# Patient Record
Sex: Female | Born: 1990 | Race: Black or African American | Hispanic: No | Smoking: Former smoker
Health system: Southern US, Community
[De-identification: ages and names within clinical notes are randomized; demographics above are authoritative.]

## PROBLEM LIST (undated history)

## (undated) DIAGNOSIS — E119 Type 2 diabetes mellitus without complications: Secondary | ICD-10-CM

## (undated) DIAGNOSIS — R51 Headache: Secondary | ICD-10-CM

## (undated) DIAGNOSIS — G932 Benign intracranial hypertension: Secondary | ICD-10-CM

## (undated) DIAGNOSIS — F32A Depression, unspecified: Secondary | ICD-10-CM

## (undated) DIAGNOSIS — F329 Major depressive disorder, single episode, unspecified: Secondary | ICD-10-CM

## (undated) DIAGNOSIS — F419 Anxiety disorder, unspecified: Secondary | ICD-10-CM

## (undated) DIAGNOSIS — G43909 Migraine, unspecified, not intractable, without status migrainosus: Secondary | ICD-10-CM

## (undated) DIAGNOSIS — O24419 Gestational diabetes mellitus in pregnancy, unspecified control: Secondary | ICD-10-CM

## (undated) DIAGNOSIS — H4710 Unspecified papilledema: Secondary | ICD-10-CM

## (undated) HISTORY — DX: Major depressive disorder, single episode, unspecified: F32.9

## (undated) HISTORY — DX: Migraine, unspecified, not intractable, without status migrainosus: G43.909

## (undated) HISTORY — DX: Gestational diabetes mellitus in pregnancy, unspecified control: O24.419

## (undated) HISTORY — DX: Benign intracranial hypertension: G93.2

## (undated) HISTORY — DX: Anxiety disorder, unspecified: F41.9

## (undated) HISTORY — PX: TONSILLECTOMY: SUR1361

## (undated) HISTORY — DX: Depression, unspecified: F32.A

## (undated) HISTORY — PX: APPENDECTOMY: SHX54

## (undated) HISTORY — DX: Headache: R51

---

## 2012-09-25 HISTORY — PX: LUMBAR PUNCTURE: SHX1985

## 2018-02-21 ENCOUNTER — Other Ambulatory Visit: Payer: Self-pay

## 2018-02-21 ENCOUNTER — Encounter: Payer: Self-pay | Admitting: Neurology

## 2018-02-21 ENCOUNTER — Ambulatory Visit (INDEPENDENT_AMBULATORY_CARE_PROVIDER_SITE_OTHER): Payer: 59 | Admitting: Neurology

## 2018-02-21 DIAGNOSIS — R519 Headache, unspecified: Secondary | ICD-10-CM | POA: Insufficient documentation

## 2018-02-21 DIAGNOSIS — R51 Headache: Secondary | ICD-10-CM

## 2018-02-21 DIAGNOSIS — G8929 Other chronic pain: Secondary | ICD-10-CM | POA: Insufficient documentation

## 2018-02-21 HISTORY — DX: Headache, unspecified: R51.9

## 2018-02-21 HISTORY — DX: Other chronic pain: G89.29

## 2018-02-21 MED ORDER — PROPRANOLOL HCL 20 MG PO TABS: ORAL_TABLET | ORAL | 3 refills | Status: DC

## 2018-02-21 NOTE — Patient Instructions (Signed)
We will start propranolol for the headache.     Inderal (propranolol) is a blood pressure medication that is commonly used for migraine headaches. This is a type of beta blocker. The most common side effects include low heart rate, dizziness, fatigue, and increased depression. This medication may worsen asthma. If you believe that you are having side effects on this medication, please contact our office.  

## 2018-02-21 NOTE — Progress Notes (Signed)
Reason for visit: Headache  Referring physician: Dr. Kirstie Peri is a 27 y.o. female  History of present illness:  [name] is a 27 year old right-handed black female with a history of headaches.  The patient initially was evaluated for headaches in 2014 in [place], [place].  The patient underwent an MRI of the brain that was unremarkable, she underwent a lumbar puncture with an opening pressure of 22, barely above normal.  The patient was diagnosed with pseudotumor cerebri at that time, she was started on Topamax which offered little benefit for her daily headaches.  The patient eventually was seen through [facility], she again was placed on Topamax again without benefit.  At some point, the patient had a repeat lumbar puncture, the patient claims that the opening pressure was 14 cm of water.  I do not see this documented in the notes.  The patient went off of the Topamax and has been off of medication since that time.  The patient has been noted in the past to have increased intraocular pressure, particularly on the left with pressures as high as 28.  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  The patient continues to have daily headaches that are mainly bifrontal in nature associated with a burning sensation.  The patient may have photophobia and phonophobia, some nausea and vomiting with the headache.  She will miss work on occasion.  She does indicate some neck stiffness, she may have some muffled hearing at times, she reports some spots in front of the eyes and some occasional sparkly lights in the vision.  The patient also reports some occasional double vision.  She denies any issues with control of the bowels or the bladder, she does have some dizziness at times and some mild gait instability.  The patient is sent to this office for further evaluation and management of the headache.  Past Medical History:  Diagnosis Date  . Anxiety   . Chronic  headache disorder 02/21/2018  . Depression   . Migraine     Past Surgical History:  Procedure Laterality Date  . APPENDECTOMY    . LUMBAR PUNCTURE  2014   2017   . TONSILLECTOMY      Family History  Problem Relation Age of Onset  . Diabetes Mother   . Migraines Father   . Diabetes Father   . Hypercholesterolemia Father   . Kidney disease Father   . Diabetes Sister     Social history:  reports that she has been smoking.  She has never used smokeless tobacco. She reports that she drinks alcohol. She reports that she does not use drugs.  Medications:  Prior to Admission medications   Not on File      Allergies  Allergen Reactions  . No Known Allergies     ROS:  Out of a complete 14 system review of symptoms, the patient complains only of the following symptoms, and all other reviewed systems are negative.  Chest pain, swelling in the legs Ringing in the ears Birthmarks Blurred vision, double vision, eye pain Snoring Lymph node swelling Joint pain, muscle cramps, aching muscles Headache, dizziness Depression, anxiety  Blood pressure 138/80, pulse 72, height  (1.651 m), weight 269 lb (122 kg).  Physical Exam  General: The patient is alert and cooperative at the time of the examination.  The patient is markedly obese.  Eyes: Pupils are equal, round, and reactive to light. Discs appear to be  blurred bilaterally, no venous pulsations are seen.  Neck: The neck is supple, no carotid bruits are noted.  Respiratory: The respiratory examination is clear.  Cardiovascular: The cardiovascular examination reveals a regular rate and rhythm, no obvious murmurs or rubs are noted.  Skin: Extremities are without significant edema.  Neurologic Exam  Mental status: The patient is alert and oriented x 3 at the time of the examination. The patient has apparent normal recent and remote memory, with an apparently normal attention span and concentration ability.  Cranial  nerves: Facial symmetry is present. There is good sensation of the face to pinprick and soft touch bilaterally. The strength of the facial muscles and the muscles to head turning and shoulder shrug are normal bilaterally. Speech is well enunciated, no aphasia or dysarthria is noted. Extraocular movements are full. Visual fields are full. The tongue is midline, and the patient has symmetric elevation of the soft palate. No obvious hearing deficits are noted.  Motor: The motor testing reveals 5 over 5 strength of all 4 extremities. Good symmetric motor tone is noted throughout.  Sensory: Sensory testing is intact to pinprick, soft touch, vibration sensation, and position sense on all 4 extremities. No evidence of extinction is noted.  Coordination: Cerebellar testing reveals good finger-nose-finger and heel-to-shin bilaterally.  Gait and station: Gait is normal. Tandem gait is normal. Romberg is negative. No drift is seen.  Reflexes: Deep tendon reflexes are symmetric and normal bilaterally. Toes are downgoing bilaterally.   Assessment/Plan:  1.  Chronic daily headache  The patient appears to have had a work-up in the past that does not confirm the diagnosis of pseudotumor cerebri.  The initial lumbar puncture opening pressure was 22, barely out of the normal range.  The patient claims that the second lumbar puncture had an opening pressure of 14 which was normal.  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  The patient will be started on propranolol taking 20 mg twice daily for 2 weeks and go to 40 mg twice daily to treat presumed migraine headache.  If the headaches persist, we may repeat MRI of the brain and consider a repeat lumbar puncture.  The intraocular pressure issue will need to be followed over time.  The patient will follow-up in about 4 months.  Marlan Palau MD 02/21/2018 8:56  AM  [facility] [address] [place], [place] [address]  Phone [phone] Fax [phone]

## 2018-05-23 ENCOUNTER — Telehealth: Payer: Self-pay | Admitting: Neurology

## 2018-05-23 MED ORDER — GABAPENTIN 300 MG PO CAPS: ORAL_CAPSULE | ORAL | 3 refills | Status: DC

## 2018-05-23 NOTE — Telephone Encounter (Signed)
The patient is on 40 mg twice daily of propranolol without any benefit.  I will send in a prescription for gabapentin and work-up on the dose.  Patient will be seen within the next month, if she is no better we may consider repeat lumbar puncture.

## 2018-05-23 NOTE — Telephone Encounter (Signed)
propranolol (INDERAL) 20 MG tablet is not helping with HA's. Pt is taking excedrin or ibuprofren on a daily basis also. Please call

## 2018-05-30 MED ORDER — PREDNISONE 5 MG PO TABS
ORAL_TABLET | ORAL | 0 refills | Status: DC
Start: 2018-05-30 — End: 2018-06-08

## 2018-05-30 NOTE — Telephone Encounter (Signed)
Pt requesting a call, stating that the medication change had not helped with her headaches. Pt then went to the ED where she was told to f/u with Dr. Anne Hahn in 24 to 48 hours. Requesting a call to see if she can be worked in sooner. Please call to advise.

## 2018-05-30 NOTE — Addendum Note (Signed)
Addended by: York Spaniel on: 05/30/2018 05:03 PM   Modules accepted: Orders

## 2018-05-30 NOTE — Telephone Encounter (Signed)
Dr. Anne Hahn- please advise. There are no available appt this week. Next available work in is Monday at 730am

## 2018-05-30 NOTE — Telephone Encounter (Signed)
I called the patient.  Patient still has ongoing daily headaches, she will go up on the gabapentin taking 300 mg in the morning and 600 mg in the evening.  I will give her a 6-day trial on prednisone, I will try to get her worked in for next week.

## 2018-05-31 NOTE — Telephone Encounter (Signed)
Called, LVM for pt offering Monday 06/03/18 at 730am for work in appt. Asked her to call back and let us know if this works.

## 2018-05-31 NOTE — Telephone Encounter (Signed)
Pt called back and spoke with phone staff, Virl Axe.She accepted appt for 06/03/18 at 730am, check in 7am. She was scheduled.

## 2018-06-03 ENCOUNTER — Ambulatory Visit (INDEPENDENT_AMBULATORY_CARE_PROVIDER_SITE_OTHER): Payer: 59 | Admitting: Neurology

## 2018-06-03 ENCOUNTER — Encounter: Payer: Self-pay | Admitting: Neurology

## 2018-06-03 ENCOUNTER — Telehealth: Payer: Self-pay | Admitting: Neurology

## 2018-06-03 VITALS — BP 163/115 | HR 71 | Ht 61.5 in | Wt 267.0 lb

## 2018-06-03 DIAGNOSIS — G4489 Other headache syndrome: Secondary | ICD-10-CM

## 2018-06-03 DIAGNOSIS — R51 Headache: Secondary | ICD-10-CM

## 2018-06-03 DIAGNOSIS — R519 Headache, unspecified: Secondary | ICD-10-CM

## 2018-06-03 MED ORDER — ALPRAZOLAM 0.5 MG PO TABS: ORAL_TABLET | ORAL | 0 refills | Status: DC

## 2018-06-03 MED ORDER — NORTRIPTYLINE HCL 10 MG PO CAPS: ORAL_CAPSULE | ORAL | 3 refills | Status: DC

## 2018-06-03 NOTE — Addendum Note (Signed)
Addended by: York Spaniel on: 06/03/2018 01:05 PM   Modules accepted: Orders

## 2018-06-03 NOTE — Patient Instructions (Signed)
Go back on the propranolol, we will start nortriptyline for the headache.  Pamelor (nortriptyline) is an antidepressant medication that has many uses that may include headache, whiplash injuries, or for peripheral neuropathy pain. Side effects may include drowsiness, dry mouth, blurred vision, or constipation. As with any antidepressant medication, worsening depression may occur. If you had any significant side effects, please call our office. The full effects of this medication may take 7-10 days after starting the drug, or going up on the dose.

## 2018-06-03 NOTE — Telephone Encounter (Signed)
A prescription for alprazolam will be sent in. 

## 2018-06-03 NOTE — Telephone Encounter (Signed)
MR Brain wo contrast Dr. Anne Hahn [facility] Auth: [id number] (exp. 06/03/18 to 07/18/18). Patient is scheduled for 06/11/18 at [facility].  Patient informed me she is claustrophobic and would like something to help her nerves.

## 2018-06-03 NOTE — Progress Notes (Signed)
Reason for visit: Headache  Kimberly Meadows is an 27 y.o. female  History of present illness:  Kimberly Meadows is a 27 year old right-handed black female with a history of chronic daily headache.  The patient has been evaluated for possible pseudotumor cerebri, apparently she has had 2 prior spinal taps that did not show extremely elevated pressures, the first opening pressure was 22 cm, the other was 14.  The patient has been on Topamax and this did not help her headache.  The patient recently was placed on propranolol taking 40 mg twice daily.  Her headaches have continued, she was placed on gabapentin within the last week or so, she felt that this was worsening her headaches, she had nausea and vomiting with this.  She stopped the gabapentin.  The patient however went off of Inderal when she started the gabapentin.  The patient indicates that she generally has early morning headaches, the headaches usually around the right eye and the right side of the head.  The patient has been found to have elevated intraocular pressures, most significant on the right.  The patient does report some photophobia and phonophobia when the headache is severe.  The patient drinks 1 cup of coffee in the morning and a 32 ounce [facility] later in the day.  She comes to this office for an evaluation.  Past Medical History:  Diagnosis Date  . Anxiety   . Chronic headache disorder 02/21/2018  . Depression   . Migraine     Past Surgical History:  Procedure Laterality Date  . APPENDECTOMY    . LUMBAR PUNCTURE  2014   2017   . TONSILLECTOMY      Family History  Problem Relation Age of Onset  . Diabetes Mother   . Migraines Father   . Diabetes Father   . Hypercholesterolemia Father   . Kidney disease Father   . Diabetes Sister     Social history:  reports that she has been smoking. She has never used smokeless tobacco. She reports that she drinks alcohol. She reports that she does not use drugs.      Allergies  Allergen Reactions  . No Known Allergies     Medications:  Prior to Admission medications   Medication Sig Start Date End Date Taking? Authorizing Provider  gabapentin (NEURONTIN) 300 MG capsule 1 capsule daily for 1 week, then take 1 twice daily Patient not taking: Reported on 06/03/2018 05/23/18   York Spaniel, MD  predniSONE (DELTASONE) 5 MG tablet Begin taking 6 tablets daily, taper by one tablet daily until off the medication. Patient not taking: Reported on 06/03/2018 05/30/18   York Spaniel, MD  propranolol (INDERAL) 20 MG tablet One tablet twice a day for 2 weeks, then take 2 tablets twice a day Patient not taking: Reported on 06/03/2018 02/21/18   York Spaniel, MD    ROS:  Out of a complete 14 system review of symptoms, the patient complains only of the following symptoms, and all other reviewed systems are negative.  Decreased appetite, chills Facial swelling, ear pain Eye itching, eye redness, light sensitivity, double vision, eye pain, blurred vision Chest tightness Abdominal pain, nausea, vomiting Dizziness, headache, weakness, facial droop  Blood pressure (!) 163/115, pulse 71, height 5' 1.5" (1.562 m), weight 267 lb (121.1 kg).  Physical Exam  General: The patient is alert and cooperative at the time of the examination.  The patient is markedly obese.  Skin: No significant peripheral edema is  noted.   Neurologic Exam  Mental status: The patient is alert and oriented x 3 at the time of the examination. The patient has apparent normal recent and remote memory, with an apparently normal attention span and concentration ability.   Cranial nerves: Facial symmetry is present. Speech is normal, no aphasia or dysarthria is noted. Extraocular movements are full. Visual fields are full.  Pupils are equal, round, and reactive to light.  Discs appear to be with blurred margins, no hemorrhages are seen.  Motor: The patient has good strength in all 4  extremities.  Sensory examination: Soft touch sensation is symmetric on the face, arms, and legs.  Coordination: The patient has good finger-nose-finger and heel-to-shin bilaterally.  Gait and station: The patient has a normal gait. Tandem gait is normal. Romberg is negative. No drift is seen.  Reflexes: Deep tendon reflexes are symmetric.   Assessment/Plan:  1.  Chronic daily headache  2.  Questionable pseudotumor cerebri  The patient will need close follow-up through ophthalmology to monitor intraocular pressures and look for developing papilledema.  The patient will undergo another MRI of the brain, the patient may be set up for a lumbar puncture.  The patient has had very high blood pressures today, this likely in part is related to withdrawal from beta-blocker medication and headache pain.  The patient believe that the gabapentin worsens her headache, but she had stopped the Inderal when she started the gabapentin.  The patient is having early morning headaches, I have asked her to cut back on her caffeine intake as she may be having rebound headaches.  Nortriptyline will be added, working up to a 30 mg dose.  The patient will go back on Inderal.  She has not gone on prednisone as prescribed yet.  She will follow-up in 3 months.  Marlan Palau MD 06/03/2018 7:30 AM  [facility] [address] [place], [place] [address]  Phone [phone] Fax [phone]

## 2018-06-04 ENCOUNTER — Telehealth: Payer: Self-pay | Admitting: Neurology

## 2018-06-04 ENCOUNTER — Other Ambulatory Visit: Payer: Self-pay

## 2018-06-04 ENCOUNTER — Encounter (HOSPITAL_COMMUNITY): Payer: Self-pay | Admitting: Emergency Medicine

## 2018-06-04 ENCOUNTER — Emergency Department (HOSPITAL_COMMUNITY)
Admission: EM | Admit: 2018-06-04 | Discharge: 2018-06-04 | Disposition: A | Discharge status: Home / Self Care | Payer: 59 | Attending: Emergency Medicine | Admitting: Emergency Medicine

## 2018-06-04 DIAGNOSIS — R030 Elevated blood-pressure reading, without diagnosis of hypertension: Secondary | ICD-10-CM | POA: Insufficient documentation

## 2018-06-04 DIAGNOSIS — G43009 Migraine without aura, not intractable, without status migrainosus: Secondary | ICD-10-CM | POA: Insufficient documentation

## 2018-06-04 DIAGNOSIS — F17210 Nicotine dependence, cigarettes, uncomplicated: Secondary | ICD-10-CM | POA: Diagnosis not present

## 2018-06-04 DIAGNOSIS — G43909 Migraine, unspecified, not intractable, without status migrainosus: Secondary | ICD-10-CM | POA: Diagnosis present

## 2018-06-04 LAB — POC URINE PREG, ED: Preg Test, Ur: NEGATIVE

## 2018-06-04 MED ORDER — ONDANSETRON 4 MG PO TBDP
4.0000 mg | ORAL_TABLET | Freq: Once | ORAL | Status: AC | PRN
  Administered 2018-06-04: 4 mg via ORAL
  Filled 2018-06-04: qty 1

## 2018-06-04 MED ORDER — PROCHLORPERAZINE EDISYLATE 10 MG/2ML IJ SOLN: 10.0000 mg | Freq: Once | INTRAMUSCULAR | Status: DC

## 2018-06-04 MED ORDER — DIPHENHYDRAMINE HCL 50 MG/ML IJ SOLN: 25.0000 mg | Freq: Once | INTRAMUSCULAR | Status: DC

## 2018-06-04 MED ORDER — DIPHENHYDRAMINE HCL 50 MG/ML IJ SOLN
12.5000 mg | Freq: Once | INTRAMUSCULAR | Status: AC
  Administered 2018-06-04: 12.5 mg via INTRAVENOUS
  Filled 2018-06-04: qty 1

## 2018-06-04 MED ORDER — SODIUM CHLORIDE 0.9 % IV BOLUS
1000.0000 mL | Freq: Once | INTRAVENOUS | Status: AC
  Administered 2018-06-04: 1000 mL via INTRAVENOUS

## 2018-06-04 MED ORDER — KETOROLAC TROMETHAMINE 15 MG/ML IJ SOLN
15.0000 mg | Freq: Once | INTRAMUSCULAR | Status: AC
  Administered 2018-06-04: 15 mg via INTRAVENOUS
  Filled 2018-06-04: qty 1

## 2018-06-04 MED ORDER — PROCHLORPERAZINE EDISYLATE 10 MG/2ML IJ SOLN
10.0000 mg | Freq: Once | INTRAMUSCULAR | Status: AC
  Administered 2018-06-04: 10 mg via INTRAVENOUS
  Filled 2018-06-04: qty 2

## 2018-06-04 MED ORDER — KETOROLAC TROMETHAMINE 15 MG/ML IJ SOLN: 15.0000 mg | Freq: Once | INTRAMUSCULAR | Status: DC

## 2018-06-04 NOTE — ED Provider Notes (Signed)
[facility] EMERGENCY DEPARTMENT Provider Note   CSN: [id number] Arrival date & time: 06/04/18  0033     History   Chief Complaint Chief Complaint  Patient presents with  . Migraine    HPI Kimberly Meadows is a 27 y.o. female with history of migraine presenting for 5 days of headache.  Patient states that her current episode began on Thursday with bilateral parietal and occipital headache that radiates down towards her bilateral neck and shoulders.  She states that the pain is throbbing in nature and 8/10 intensity.  She states that the pain has been constant for the last 4 days.  Patient states that she also developed intermittent nausea and vomiting on Thursday.  She states that it is normal for her to have nausea and vomiting when she has migraines.  Patient states that her headache is made worse by light and loud noises. Patient states that this is the normal type of pain and duration for her migraines, however she does state that her pain is somewhat further back in her head than normal, she states that the pain normally originates around her eyes.  Patient denies history of fevers, vision changes, gait problems, speech problems, facial asymmetry, numbness/tingling or weakness.  Patient was seen yesterday at [facility] where she states that her medications were changed and she was started on nortriptyline and restarted on her propanolol.  Patient states that she was taken off her propanolol 2 weeks ago by another provider so she has not been taking this medication.  Patient states that she had 1 dose of nortriptyline yesterday around 7 PM without relief of her headache and she presented to the emergency department.  Patient states she was recently taken off her propanolol two weeks ago before being told to restart it yesterday. She states that she has not restarted propanolol yet. Patient states that her blood pressure has been elevated since stopping  the propanolol.  From 06/03/2018 shows her assessment of chronic daily headache and questionable pseudotumor cerebri it appears that the patient had a lumbar puncture in 2014 with opening pressures of 22.  It appears their plan as of yesterday was to have the patient follow-up with ophthalmology to monitor intraocular pressures and possible future LP and MRI with follow-up in 3 months and to start propanolol and nortriptyline.  HPI  Past Medical History:  Diagnosis Date  . Anxiety   . Chronic headache disorder 02/21/2018  . Depression   . Migraine     Patient Active Problem List   Diagnosis Date Noted  . Chronic headache disorder 02/21/2018    Past Surgical History:  Procedure Laterality Date  . APPENDECTOMY    . LUMBAR PUNCTURE  2014   2017   . TONSILLECTOMY       OB History   None      Home Medications    Prior to Admission medications   Medication Sig Start Date End Date Taking? Authorizing Provider  ALPRAZolam Prudy Feeler) 0.5 MG tablet Take 2 tablets approximately 45 minutes prior to the MRI study, take a third tablet if needed. 06/03/18   York Spaniel, MD  gabapentin (NEURONTIN) 300 MG capsule 1 capsule daily for 1 week, then take 1 twice daily Patient not taking: Reported on 06/03/2018 05/23/18   York Spaniel, MD  nortriptyline (PAMELOR) 10 MG capsule Take one capsule at night for one week, then take 2 capsules at night for one week, then take 3 capsules at night 06/03/18  York Spaniel, MD  predniSONE (DELTASONE) 5 MG tablet Begin taking 6 tablets daily, taper by one tablet daily until off the medication. Patient not taking: Reported on 06/03/2018 05/30/18   York Spaniel, MD  propranolol (INDERAL) 20 MG tablet One tablet twice a day for 2 weeks, then take 2 tablets twice a day Patient not taking: Reported on 06/03/2018 02/21/18   York Spaniel, MD    Family History Family History  Problem Relation Age of Onset  . Diabetes Mother   . Migraines Father   .  Diabetes Father   . Hypercholesterolemia Father   . Kidney disease Father   . Diabetes Sister     Social History Social History   Tobacco Use  . Smoking status: Current Every Day Smoker  . Smokeless tobacco: Never Used  Substance Use Topics  . Alcohol use: Yes    Comment: rare  . Drug use: Never     Allergies   No known allergies   Review of Systems Review of Systems  Constitutional: Negative.  Negative for chills, fatigue and fever.  Eyes: Positive for photophobia. Negative for visual disturbance.  Gastrointestinal: Positive for nausea and vomiting. Negative for abdominal pain and diarrhea.  Musculoskeletal: Positive for neck pain. Negative for gait problem and neck stiffness.  Skin: Negative.  Negative for rash.  Neurological: Positive for headaches. Negative for dizziness, syncope, weakness, light-headedness and numbness.  All other systems reviewed and are negative.    Physical Exam Updated Vital Signs BP (!) 165/95   Pulse 62   Temp 98.1 F (36.7 C) (Oral)   Resp 16   SpO2 100%   Physical Exam  Constitutional: She is oriented to person, place, and time. She appears well-developed and well-nourished. No distress.  HENT:  Head: Normocephalic and atraumatic.  Right Ear: External ear normal.  Left Ear: External ear normal.  Nose: Nose normal.  Eyes: Pupils are equal, round, and reactive to light. Conjunctivae and EOM are normal.  Neck: Trachea normal, normal range of motion, full passive range of motion without pain and phonation normal. Neck supple. No spinous process tenderness present. No neck rigidity. No tracheal deviation present. No Brudzinski's sign and no Kernig's sign noted.  Cardiovascular: Normal rate, regular rhythm, normal heart sounds and intact distal pulses.  Pulmonary/Chest: Effort normal and breath sounds normal. No respiratory distress.  Abdominal: Soft. Bowel sounds are normal. There is no tenderness. There is no rebound and no guarding.    Musculoskeletal: Normal range of motion.  Neurological: She is alert and oriented to person, place, and time. She has normal strength. No cranial nerve deficit or sensory deficit. She displays a negative Romberg sign. Coordination normal. GCS eye subscore is 4. GCS verbal subscore is 5. GCS motor subscore is 6.  Mental Status: Alert, oriented, thought content appropriate, able to give a coherent history. Speech fluent without evidence of aphasia. Able to follow 2 step commands without difficulty. Cranial Nerves: II: Peripheral visual fields grossly normal, pupils equal, round, reactive to light III,IV, VI: ptosis not present, extra-ocular motions intact bilaterally V,VII: smile symmetric, eyebrows raise symmetric, facial light touch sensation equal VIII: hearing grossly normal to voice X: uvula elevates symmetrically XI: bilateral shoulder shrug symmetric and strong XII: midline tongue extension without fassiculations Motor: Normal tone. 5/5 strength in upper and lower extremities bilaterally including strong and equal grip strength and dorsiflexion/plantar flexion Sensory: Sensation intact to light touch in all extremities.Negative Romberg.  Cerebellar: normal finger-to-nose with bilateral upper extremities.  Normal heel-to -shin balance bilaterally of the lower extremity. No pronator drift.  Gait: normal gait and balance CV: distal pulses palpable throughout  Skin: Skin is warm and dry. Capillary refill takes less than 2 seconds.  Psychiatric: She has a normal mood and affect. Her behavior is normal.     ED Treatments / Results  Labs (all labs ordered are listed, but only abnormal results are displayed) Labs Reviewed  POC URINE PREG, ED    EKG None  Radiology No results found.  Procedures Procedures (including critical care time)  Medications Ordered in ED Medications  ondansetron (ZOFRAN-ODT) disintegrating tablet 4 mg (4 mg Oral Given 06/04/18 0111)  sodium  chloride 0.9 % bolus 1,000 mL (0 mLs Intravenous Stopped 06/04/18 1032)  ondansetron (ZOFRAN-ODT) disintegrating tablet 4 mg (4 mg Oral Given 06/04/18 0719)  ketorolac (TORADOL) 15 MG/ML injection 15 mg (15 mg Intravenous Given 06/04/18 0809)  prochlorperazine (COMPAZINE) injection 10 mg (10 mg Intravenous Given 06/04/18 0809)  diphenhydrAMINE (BENADRYL) injection 12.5 mg (12.5 mg Intravenous Given 06/04/18 0809)     Initial Impression / Assessment and Plan / ED Course  I have reviewed the triage vital signs and the nursing notes.  Pertinent labs & imaging results that were available during my care of the patient were reviewed by me and considered in my medical decision making (see chart for details).  Clinical Course as of Jun 04 1118  Tue Jun 04, 2018  1610 On reevaluation patient states that she is feeling improved.  Describes her headache as minimal at this time.   [BM]  1038 Informed by RN that headache has been alleviated.    [BM]    Clinical Course User Index [BM] Bill Salinas, PA-C   Deniss Wormley is a 27 y.o. female who presents to ED for five days of headache consistent with her typical migraine. No focal neuro deficits on exam. Migraine cocktail and fluids given, headache treated and improved while in ED; patient denies any pain at time of discharge.  Nausea controlled with Zofran, no vomiting since administration, tolerating p.o. fluids without difficulty or vomiting.  On re-evaluation, patient well appearing, sitting comfortably in bed, NAD. The patient denies any neurologic symptoms such as visual changes, focal numbness/weakness, balance problems, confusion, or speech difficulty to suggest a life-threatening intracranial process such as intracranial hemorrhage or mass. The patient has no clotting risk factors thus venous sinus thrombosis is unlikely. No fevers, neck pain or nuchal rigidity to suggest meningitis. Patient is afebrile, non-toxic and well appearing. Reassuring  neuro exam, normal gait to the door and back. No cranial deficits, no speech deficits, negative pronator drift, normal/equal strength to all extremities.   I feel that the patient is safe for discharge home at this time. Neurology and PCP follow up strongly encouraged. I have reviewed return precautions including development of fever, nausea/vomiting or neurologic symptoms, vision changes, confusion, lethargy, difficulty speaking/walking, or other new/worsening/concerning symptoms. Patient states understanding of return precautions. Patient is agreeable to discharge. All questions answered.  The patient was noted to have elevated BP in ED today. Hx of HTN and has not taken daily medications in two weeks. I have spoken with the patient regarding elevated blood pressure readings and the need for improved management. I instructed the patient to followup with their PCP within 1 week for BP check. I also counseled the patient regarding the signs and symptoms which would require an emergent visit to an emergency department for hypertensive urgency and/or hypertensive emergency.  At this time there does not appear to be any evidence of an acute emergency medical condition and the patient appears stable for discharge with appropriate outpatient follow up. Diagnosis was discussed with patient who verbalizes understanding of care plan and is agreeable to discharge. I have discussed return precautions with patient who verbalize understanding of return precautions. Patient strongly encouraged to follow-up with their PCP. All questions answered.  Patient's case discussed with Dr. Deretha Emory who agrees with plan to discharge with follow-up.     Note: Portions of this report may have been transcribed using voice recognition software. Every effort was made to ensure accuracy; however, inadvertent computerized transcription errors may still be present.  Final Clinical Impressions(s) / ED Diagnoses   Final diagnoses:    Migraine without aura and without status migrainosus, not intractable  Elevated blood pressure reading    ED Discharge Orders    None       Elizabeth Palau 06/04/18 1129    Vanetta Mulders, MD 06/09/18 [phone]

## 2018-06-04 NOTE — ED Notes (Signed)
Pt crying, experiencing increased pain d/t light and noise in hall bed. Moved to treatment room.

## 2018-06-04 NOTE — Discharge Instructions (Addendum)
Please return to the Emergency Department for any new or worsening symptoms or if your symptoms do not improve. Please be sure to follow up with your Primary Care Physician as soon as possible regarding your visit today. If you do not have a Primary Doctor please use the resources below to establish one. Please take your prophylactic migraine medications as directed by your neurologist.  Please call your neurologist today to schedule a follow-up appointment for as soon as possible regarding your migraine. Your blood pressure was elevated today, please take your blood pressure medication as directed by your primary care doctor.  Please follow-up with your primary care doctor as soon as possible for blood pressure recheck and medication management.  Contact a health care provider if: You develop symptoms that are different or more severe than your usual migraine symptoms. Get help right away if: Your migraine becomes severe. You have a fever. You have a stiff neck. You have vision loss. Your muscles feel weak or like you cannot control them. You start to lose your balance often. You develop trouble walking. You faint.  RESOURCE GUIDE  Chronic Pain Problems: Contact [facility]  [phone] Patients need to be referred by their primary care doctor.  Insufficient Money for Medicine: Contact United Way:  call "211" or [facility] (928)804-9331.  No Primary Care Doctor: Call Health Connect  [phone] - can help you locate a primary care doctor that  accepts your insurance, provides certain services, etc. Physician Referral Service- [phone]  Agencies that provide inexpensive medical care: [facility]  [phone] [facility]  [phone] [facility]  [phone] [facility]  [phone] Planned Parenthood  [phone] [facility]  [phone]  Medicaid-accepting [facility] Providers: [facility]-  [address]  [phone], Mon-Fri 9am-7pm, Sat 9am-1pm [facility]- [address] [place], Suite [place]  [phone] [facility]- [address], [address] [place]  [phone] [facility]- [address]  [phone] Renaye Rakers- [address] [place], [address], [phone]  Only accepts [place] patients after they have their name  applied to their card  Self Pay (no insurance) in [facility]: Sickle Cell Patients: Dr Willey Blade, [facility]  [address] [place], [phone] [facility]- [address] [place]  [phone]       [facility] [place]- [address]       -     [facility]- see information above (Speak to [employer] if you do not have insurance)       -  [facility]- [address] [place], [phone]       -  [facility]- [address],  [phone]       -  [facility]- [address] [phone]       -  Dr Julio Sicks-  [address], [place], [phone]       -  [facility]- [address] [phone]       -  [facility]- [address] [phone], also [address], [phone]       -    [facility]- [address] [place], [phone], 1st & 3rd Saturday   every month, 10am-1pm  1) Find a Librarian, academic and Pay Out of Pocket Although you won't have to find out who is covered by your insurance plan, it is a good idea to ask around and get recommendations. You will then need to call the office and see if the doctor you have chosen will accept you as a new patient and what types of options they offer for patients who are self-pay. Some doctors offer discounts or will set up payment plans for their patients who do not have insurance, but you will need to ask so you aren't surprised when you get to your appointment.  2) Contact Your Local Health Department Not all  health departments have doctors that can see patients for sick visits, but many do, so it is worth a call to see if yours does. If you don't know where your local health department is, you can check in your phone book. The CDC also has a tool to help you locate your state's health department, and many state websites also have listings of all of their local health departments.  3) Find a Walk-in Clinic If your illness is not likely to be very severe or complicated, you may want to try a walk in clinic. These are popping up all over the country in pharmacies, drugstores, and shopping centers. They're usually staffed by nurse practitioners or physician assistants that have been trained to treat common illnesses and complaints. They're usually fairly quick and inexpensive. However, if you have serious medical issues or chronic medical problems, these are probably not your best option  STD Testing [facility] Department of [facility], STD Clinic, [address], [place], phone [phone] or [phone].  Monday - Friday, call for an appointment. [facility] Department of [employer], STD Clinic, [place] [address], [place], phone [phone] or [phone].  Monday - Friday, call for an appointment.  Abuse/Neglect: [facility] Child Abuse Hotline 832-415-4875 [facility] Child Abuse Hotline 561-867-6253 (After Hours)  Emergency Shelter:  [facility] 519-475-8878  Maternity Homes: Room at the [place] [facility] 947-605-3761 [facility] ([phone]  MRSA Hotline #:   (616)508-7831  [facility] Resources  [facility] [place]  United [employer] [facility] Dept. [address]                 [address]         [address] [place] [address]  Blondell Reveal Phone:  [phone]                                   Phone:  [phone]                   Phone:  (820)[phone]  [facility], [phone] [phone]       -     [facility] in [place], [place] [address]  [address]                                  ([phone], [facility] Child Abuse Hotline 416-110-1535 or 613-178-5395 (After Hours)   Behavioral Health Services  Substance Abuse Resources: Alcohol and Drug Services  831-435-3986 [facility] [phone] [facility] [place] [phone] Floydene Flock ([phone] Residential & Outpatient Substance Abuse Program  [phone]  Psychological Services: [facility] Health  [phone] [facility]  [phone] [facility], ([phone] [place]. [address], [place], ACCESS LINE: 680-887-6924 or [phone], [employer]za  Dental Assistance  If unable to pay or uninsured, contact:  [facility] or [facility]. to become qualified for the adult dental clinic.  Patients with Medicaid: [facility] [phone] [address], [phone] [address] [phone]  If unable to pay, or uninsured, contact [facility] [phone]) or [facility] Department [phone] in [place], [phone] in [facility]) to become qualified for the adult dental clinic   Other Low-Cost Community Dental Services: [facility]- [address] [place], [place], [place], [address], [phone], [address], 2nd and 4th Thursday of the month at 6:30am.  10 clients each day by appointment, can sometimes see walk-in patients if someone does not show for an appointment. [facility]- [address] [place], [place], [address], [phone] [facility] [address] [place], [place], [address], [phone] [facility] Health Department- [phone] [facility] Health Department-  [phone] [facility] [phone]

## 2018-06-04 NOTE — ED Triage Notes (Signed)
Pt is under treatment by neuro for frequent headaches. Has had a bad headache since Thursday with nausea and vomiting. Was seen today and tried a new medication with no relief.

## 2018-06-04 NOTE — ED Notes (Signed)
Attempted to gain IV access x2, without success.  

## 2018-06-05 ENCOUNTER — Ambulatory Visit (INDEPENDENT_AMBULATORY_CARE_PROVIDER_SITE_OTHER): Payer: 59

## 2018-06-05 DIAGNOSIS — G4489 Other headache syndrome: Secondary | ICD-10-CM | POA: Diagnosis not present

## 2018-06-06 ENCOUNTER — Telehealth: Payer: Self-pay | Admitting: Neurology

## 2018-06-06 DIAGNOSIS — H4710 Unspecified papilledema: Secondary | ICD-10-CM

## 2018-06-06 NOTE — Telephone Encounter (Signed)
I called the patient.  MRI of the brain is unremarkable.  I will go ahead and get the spinal tap set up, if the pressure is elevated, we will initiate treatment.   MRI brain 06/06/18:  IMPRESSION:  Unremarkable MRI scan of the brain without contrast. Incidental noted finding of partially empty sella, low-lying cerebellar tonsils and mild supratentorial cortical atrophy.

## 2018-06-07 ENCOUNTER — Encounter (HOSPITAL_COMMUNITY): Payer: Self-pay | Admitting: Emergency Medicine

## 2018-06-07 ENCOUNTER — Other Ambulatory Visit: Payer: Self-pay

## 2018-06-07 ENCOUNTER — Emergency Department (HOSPITAL_COMMUNITY)
Admission: EM | Admit: 2018-06-07 | Discharge: 2018-06-08 | Disposition: A | Discharge status: Home / Self Care | Payer: 59 | Attending: Emergency Medicine | Admitting: Emergency Medicine

## 2018-06-07 DIAGNOSIS — R51 Headache: Secondary | ICD-10-CM

## 2018-06-07 DIAGNOSIS — F17200 Nicotine dependence, unspecified, uncomplicated: Secondary | ICD-10-CM | POA: Insufficient documentation

## 2018-06-07 DIAGNOSIS — G932 Benign intracranial hypertension: Secondary | ICD-10-CM

## 2018-06-07 DIAGNOSIS — R519 Headache, unspecified: Secondary | ICD-10-CM

## 2018-06-07 MED ORDER — HYDROCODONE-ACETAMINOPHEN 5-325 MG PO TABS
1.0000 | ORAL_TABLET | Freq: Once | ORAL | Status: AC
  Administered 2018-06-07: 1 via ORAL
  Filled 2018-06-07: qty 1

## 2018-06-07 MED ORDER — ONDANSETRON 4 MG PO TBDP
4.0000 mg | ORAL_TABLET | Freq: Once | ORAL | Status: AC
  Administered 2018-06-07: 4 mg via ORAL
  Filled 2018-06-07: qty 1

## 2018-06-07 NOTE — ED Triage Notes (Addendum)
Pt sent her by PCP, reports migraine x 1 week. Pt had MRI this week and was told to come here due to worsening bilateral disc edema and hx of idiopathic intra-cranial hypertension. PCP recommending lumbar puncture. Pt reports blurry vision.

## 2018-06-07 NOTE — ED Provider Notes (Signed)
Patient placed in Quick Look pathway, seen and evaluated   Chief Complaint: Headache  HPI:   Patient with history of idiopathic intercranial hypertension, 2 previous lumbar punctures last performed years ago, no current treatment presents with worsening diplopia and headaches.  Patient had an MRI 2 days ago which was essentially negative.  She saw her eye doctor today who noted worsening bilateral papilledema prompting emergency department evaluation tonight.  Patient presents with instructions requesting LP and initiating treatment with Diamox.  ROS:  Positive ROS: (+) Diplopia, headache Negative ROS: (-) Weakness in the arms of the legs, facial droop, slurred speech, difficulty walking  Physical Exam:   Gen: No distress  Neuro: Awake and Alert  Skin: Warm    Focused Exam: Eyes clear fundi with papilledema bilaterally; Heart RRR, nml S1,S2, no m/r/g; Lungs CTAB; Abd soft, NT, no rebound or guarding; Ext 2+ pedal pulses bilaterally, no edema; Neuro cranial nerves grossly intact, patient moves upper and lower extremities well with normal strength and coordination.  BP (!) 165/110   Pulse 77   Temp 98.3 F (36.8 C) (Oral)   Resp 18   Ht 5\' 4"  (1.626 m)   Wt 120.2 kg   LMP 05/11/2018   SpO2 99%   BMI 45.49 kg/m   Plan: Monitor.  No indications for additional imaging or lab work-up at this time.  Initiation of care has begun. The patient has been counseled on the process, plan, and necessity for staying for the completion/evaluation, and the remainder of the medical screening examination    Renne CriglerGeiple, Lon Klippel, Cordelia Poche-C 06/07/18 1904    Mesner, Barbara CowerJason, MD 06/08/18 [phone]

## 2018-06-08 ENCOUNTER — Emergency Department (HOSPITAL_COMMUNITY): Payer: 59

## 2018-06-08 LAB — CSF CELL COUNT WITH DIFFERENTIAL
RBC Count, CSF: 0 /mm3
TUBE #: 3
WBC, CSF: 0 /mm3 (ref 0–5)

## 2018-06-08 LAB — GLUCOSE, CSF: GLUCOSE CSF: 60 mg/dL (ref 40–70)

## 2018-06-08 LAB — POC URINE PREG, ED: Preg Test, Ur: NEGATIVE

## 2018-06-08 LAB — PROTEIN, CSF: TOTAL PROTEIN, CSF: 12 mg/dL — AB (ref 15–45)

## 2018-06-08 MED ORDER — SODIUM CHLORIDE 0.9 % IV BOLUS: 1000.0000 mL | Freq: Once | INTRAVENOUS | Status: DC

## 2018-06-08 MED ORDER — HYDROCODONE-ACETAMINOPHEN 5-325 MG PO TABS
1.0000 | ORAL_TABLET | Freq: Once | ORAL | Status: AC
  Administered 2018-06-08: 1 via ORAL
  Filled 2018-06-08: qty 1

## 2018-06-08 MED ORDER — METOCLOPRAMIDE HCL 5 MG/ML IJ SOLN: 10.0000 mg | Freq: Once | INTRAMUSCULAR | Status: DC

## 2018-06-08 MED ORDER — DEXAMETHASONE SODIUM PHOSPHATE 10 MG/ML IJ SOLN: 10.0000 mg | Freq: Once | INTRAMUSCULAR | Status: DC

## 2018-06-08 MED ORDER — METOCLOPRAMIDE HCL 10 MG PO TABS: 10.0000 mg | ORAL_TABLET | Freq: Four times a day (QID) | ORAL | 0 refills | Status: DC | PRN

## 2018-06-08 MED ORDER — METOCLOPRAMIDE HCL 10 MG PO TABS
10.0000 mg | ORAL_TABLET | Freq: Once | ORAL | Status: AC
  Administered 2018-06-08: 10 mg via ORAL
  Filled 2018-06-08: qty 1

## 2018-06-08 MED ORDER — KETOROLAC TROMETHAMINE 30 MG/ML IJ SOLN: 30.0000 mg | Freq: Once | INTRAMUSCULAR | Status: DC

## 2018-06-08 MED ORDER — DIPHENHYDRAMINE HCL 50 MG/ML IJ SOLN: 25.0000 mg | Freq: Once | INTRAMUSCULAR | Status: DC

## 2018-06-08 MED ORDER — LIDOCAINE HCL (PF) 1 % IJ SOLN
INTRAMUSCULAR | Status: AC
  Administered 2018-06-08: 1 mL via SUBCUTANEOUS
  Filled 2018-06-08: qty 5

## 2018-06-08 NOTE — ED Notes (Signed)
Report taken from night shift RN - care assumed at this time - resting quietly on stretcher with parents at bedside

## 2018-06-08 NOTE — ED Notes (Signed)
Pt states she does not want any needles. Refuses to have IV started and refusing all IV medicines.

## 2018-06-08 NOTE — ED Notes (Signed)
Spoke with radiology staff in reference to when they would be able to take pt - states radiologist arrived at 0900 - was setting up for procedure - would be ready at approx 1000 - additionally requested preg test to be done - urine sent to mini lab for same - will notify radiology when resulted - pt aware of same

## 2018-06-08 NOTE — ED Notes (Signed)
Pt sitting upright on stretcher with parents at bedside; pt states feeling "much better"; ready for d/c

## 2018-06-08 NOTE — ED Notes (Signed)
Pt assisted to rgt lateral recumbent position with towels placed behind back for support - states presently comfortable

## 2018-06-08 NOTE — ED Notes (Signed)
3 cups of ice water and 2 cups of apple sauce given - pt tolerating well

## 2018-06-08 NOTE — ED Notes (Signed)
Assisted pt to an upright position - sitting on bedside with feet dangling - denies dizziness

## 2018-06-08 NOTE — ED Provider Notes (Signed)
8:12 AM Care assumed from Dr. Judd Lienelo.  At time of transfer care, patient is awaiting therapeutic and diagnostic lumbar puncture by radiology team.  Previous ED physician spoke with Dr. Marisue HumbleSanford with radiology who reports someone will do it this morning.  Associated diagnostic spinal fluid orders will reportedly be placed when it is performed per the order set.  Plan of care is to follow-up on LP results and reassess patient to determine disposition.  9:00 AM Radiology called to now request that the specimen orders on the CSF be placed.  These orders were placed.  Anticipate following up on the results.  Results of LP showed no evidence of white blood cells or organisms in the CSF.  Other laboratory testing was reassuring.  CSF culture was sent.  Patient reports her headache has began to improve after the LP.  The opening pressure appears to document at 29 cm which is supportive of the possible pseudotumor diagnosis.    Patient will follow-up with her neurologist and your PCP.  Patient reports the Reglan helped her headache earlier.  Patient given prescription for oral Reglan.  Patient will follow-up with her previous team and understood return precautions.  Patient discharged in good condition for further outpatient management of her headaches.   Clinical Impression: 1. Bad headache   2. Pseudotumor cerebri     Disposition: Discharge  Condition: Good  I have discussed the results, Dx and Tx plan with the pt(& family if present). He/she/they expressed understanding and agree(s) with the plan. Discharge instructions discussed at great length. Strict return precautions discussed and pt &/or family have verbalized understanding of the instructions. No further questions at time of discharge.    New Prescriptions   METOCLOPRAMIDE (REGLAN) 10 MG TABLET    Take 1 tablet (10 mg total) by mouth every 6 (six) hours as needed for nausea (headache).    Follow Up: your neurologist and  PCP     [facility] EM[facility] [address] [id number] mc [place] [id number]       Tegeler, Canary Brimhristopher J, MD 06/08/18 [phone]

## 2018-06-08 NOTE — ED Provider Notes (Signed)
[facility] Provider Note   CSN: [id number] Arrival date & time: 06/07/18  1827     History   Chief Complaint Chief Complaint  Patient presents with  . Migraine    HPI Kimberly Meadows is a 27 y.o. female.  Patient is a 27 year old female with history of chronic headaches, pseudotumor cerebri.  She presents today for evaluation of headache and requesting an LP.  She was seen by both her neurologist and ophthalmologist in recent days.  She had an MRI which was negative, but her ophthalmologist stated that she had bilateral disc edema.  She was referred here for diagnostic/therapeutic LP and possible medical treatment.  Patient describes pressure throughout her head along with blurry vision that has worsened over the past week.  She denies any fevers, chills, or stiff neck.  The history is provided by the patient.  Migraine     Past Medical History:  Diagnosis Date  . Anxiety   . Chronic headache disorder 02/21/2018  . Depression   . Migraine     Patient Active Problem List   Diagnosis Date Noted  . Chronic headache disorder 02/21/2018    Past Surgical History:  Procedure Laterality Date  . APPENDECTOMY    . LUMBAR PUNCTURE  2014   2017   . TONSILLECTOMY       OB History   None      Home Medications    Prior to Admission medications   Medication Sig Start Date End Date Taking? Authorizing Provider  ALPRAZolam Prudy Feeler(XANAX) 0.5 MG tablet Take 2 tablets approximately 45 minutes prior to the MRI study, take a third tablet if needed. 06/03/18   York SpanielWillis, Charles K, MD  gabapentin (NEURONTIN) 300 MG capsule 1 capsule daily for 1 week, then take 1 twice daily Patient not taking: Reported on 06/03/2018 05/23/18   York SpanielWillis, Charles K, MD  nortriptyline (PAMELOR) 10 MG capsule Take one capsule at night for one week, then take 2 capsules at night for one week, then take 3 capsules at night 06/03/18   York SpanielWillis, Charles K, MD  predniSONE (DELTASONE) 5  MG tablet Begin taking 6 tablets daily, taper by one tablet daily until off the medication. Patient not taking: Reported on 06/03/2018 05/30/18   York SpanielWillis, Charles K, MD  propranolol (INDERAL) 20 MG tablet One tablet twice a day for 2 weeks, then take 2 tablets twice a day Patient not taking: Reported on 06/03/2018 02/21/18   York SpanielWillis, Charles K, MD    Family History Family History  Problem Relation Age of Onset  . Diabetes Mother   . Migraines Father   . Diabetes Father   . Hypercholesterolemia Father   . Kidney disease Father   . Diabetes Sister     Social History Social History   Tobacco Use  . Smoking status: Current Every Day Smoker  . Smokeless tobacco: Never Used  Substance Use Topics  . Alcohol use: Yes    Comment: rare  . Drug use: Never     Allergies   No known allergies   Review of Systems Review of Systems  All other systems reviewed and are negative.    Physical Exam Updated Vital Signs BP 135/87 (BP Location: Right Arm)   Pulse 62   Temp 97.7 F (36.5 C) (Oral)   Resp 16   Ht 5\' 4"  (1.626 m)   Wt 120.2 kg   LMP 05/11/2018   SpO2 96%   BMI 45.49 kg/m   Physical Exam  Constitutional: She is oriented to person, place, and time. She appears well-developed and well-nourished. No distress.  HENT:  Head: Normocephalic and atraumatic.  Eyes: Pupils are equal, round, and reactive to light. EOM are normal.  Funduscopic exam is somewhat limited, however I appreciate no significant papilledema.  Neck: Normal range of motion. Neck supple.  Cardiovascular: Normal rate and regular rhythm. Exam reveals no gallop and no friction rub.  No murmur heard. Pulmonary/Chest: Effort normal and breath sounds normal. No respiratory distress. She has no wheezes.  Abdominal: Soft. Bowel sounds are normal. She exhibits no distension. There is no tenderness.  Musculoskeletal: Normal range of motion.  Neurological: She is alert and oriented to person, place, and time. No cranial  nerve deficit. She exhibits normal muscle tone. Coordination normal.  Skin: Skin is warm and dry. She is not diaphoretic.  Nursing note and vitals reviewed.    ED Treatments / Results  Labs (all labs ordered are listed, but only abnormal results are displayed) Labs Reviewed - No data to display  EKG None  Radiology No results found.  Procedures Procedures (including critical care time)  Medications Ordered in ED Medications  HYDROcodone-acetaminophen (NORCO/VICODIN) 5-325 MG per tablet 1 tablet (1 tablet Oral Given 06/07/18 1915)  ondansetron (ZOFRAN-ODT) disintegrating tablet 4 mg (4 mg Oral Given 06/07/18 1914)     Initial Impression / Assessment and Plan / ED Course  I have reviewed the triage vital signs and the nursing notes.  Pertinent labs & imaging results that were available during my care of the patient were reviewed by me and considered in my medical decision making (see chart for details).  Patient was sent here by her optometrist and/or neurologist for a lumbar puncture.  She has a history of pseudotumor cerebri and has been having headaches recently.  She was told by her optometrist that she had bilateral papilledema and that she needed to come here for an LP.  She was told that an interventional radiologist would perform this test upon arriving here in the ER.  The patient arrived here at approximately 5:00, then waited in the waiting room for over 6 hours before being roomed.  When I saw the patient, she was very fatigued, unhappy, and frustrated.  I called interventional radiology as well as radiology, both who told me that this was not a procedure that would be done in the night, but could be performed in the morning.  I informed the patient about the timing of this procedure and offered to perform the lumbar puncture myself.  Her reply was "I do not want just anyone sticking a needle in my back".    She was very angry about this entire situation, but has decided  to wait until morning when radiology will do the study.  I have spoken with Dr. Marisue Humble from radiology who is aware of the situation and has assured me that this study can be performed in the morning.  Final Clinical Impressions(s) / ED Diagnoses   Final diagnoses:  None    ED Discharge Orders    None       Geoffery Lyons, MD 06/08/18 [phone]

## 2018-06-08 NOTE — ED Notes (Signed)
Pt transported for LP

## 2018-06-08 NOTE — ED Notes (Signed)
Pt ambulated to restroom without event; ED MD aware of present BP; pt reports no known h/o HTN; ED MD states to hydrate pt and reassess in approx 30 min - pt made aware of same

## 2018-06-08 NOTE — Discharge Instructions (Signed)
Your lumbar puncture results today did not show evidence of infection.  Your pressure was elevated and based on the report of your ophthalmologist, we suspect you have pseudotumor cerebri, or elevated CSF pressures causing headache.  Please follow-up with your primary doctor and your neurologist.  If any symptoms change or worsen, please return to the nearest emergency department.

## 2018-06-08 NOTE — ED Notes (Signed)
From radiology via stretcher per rad tech - awake, alert, oriented x4; pt informed that she must lie flat x3 hours post LP - pt verbalized understanding - family remains at bedside

## 2018-06-08 NOTE — ED Notes (Signed)
3 hrs have passed since LP being done; HOB elevated 30 degrees - pt tolerating well - will medicate for pain then prepare for d/c home

## 2018-06-08 NOTE — ED Notes (Signed)
Pt sent from optometrist for a referred lumbar puncture. Stated that she's been waiting since 5pm for procedure after checking in. Dr. Judd Lienelo said that he would perform the procedure and the pt refused, stating "I don't just a random doctor sticking a needle in me." Dr. Judd Lienelo made several consults to arrange procedure with radiology and went to update pt on status. Pt and family highly upset they would have to wait until morning. Continued to yell and curse at Dr. Judd Lienelo.

## 2018-06-08 NOTE — ED Notes (Signed)
Family members given sodas and coffee

## 2018-06-08 NOTE — ED Notes (Signed)
HOB elevated 45 degrees - pt reports feeling "sick and in pain"; pt appears pale and labile; skin warm, dry - will continue to monitor

## 2018-06-08 NOTE — Procedures (Signed)
Fluoro guided lumbar puncture performed at L2-L3.  Opening pressure 29 cm H2O.  12.5 ml clear CSF obtained.  No complications.  Please see full dictation in PACS for additional details.

## 2018-06-11 ENCOUNTER — Other Ambulatory Visit: Payer: 59

## 2018-06-11 LAB — CSF CULTURE W GRAM STAIN: Culture: NO GROWTH

## 2018-06-11 LAB — CSF CULTURE

## 2018-06-17 ENCOUNTER — Ambulatory Visit: Payer: 59 | Admitting: Neurology

## 2018-06-17 NOTE — Telephone Encounter (Signed)
error 

## 2018-07-10 ENCOUNTER — Encounter: Payer: Self-pay | Admitting: Neurology

## 2018-07-10 ENCOUNTER — Other Ambulatory Visit: Payer: Self-pay | Admitting: Neurology

## 2018-07-11 ENCOUNTER — Institutional Professional Consult (permissible substitution): Payer: 59 | Admitting: Neurology

## 2018-09-23 ENCOUNTER — Ambulatory Visit: Payer: 59 | Admitting: Neurology

## 2018-09-23 ENCOUNTER — Telehealth: Payer: Self-pay | Admitting: Neurology

## 2018-09-23 ENCOUNTER — Encounter: Payer: Self-pay | Admitting: Neurology

## 2018-09-23 NOTE — Telephone Encounter (Signed)
This patient did not show for a revisit appointment today. 

## 2018-10-02 ENCOUNTER — Encounter: Payer: Self-pay | Admitting: Neurology

## 2018-10-02 ENCOUNTER — Ambulatory Visit (INDEPENDENT_AMBULATORY_CARE_PROVIDER_SITE_OTHER): Payer: 59 | Admitting: Neurology

## 2018-10-02 VITALS — BP 146/105 | HR 80 | Ht 61.5 in | Wt 267.0 lb

## 2018-10-02 DIAGNOSIS — G932 Benign intracranial hypertension: Secondary | ICD-10-CM | POA: Insufficient documentation

## 2018-10-02 DIAGNOSIS — H4710 Unspecified papilledema: Secondary | ICD-10-CM | POA: Diagnosis not present

## 2018-10-02 DIAGNOSIS — R519 Headache, unspecified: Secondary | ICD-10-CM | POA: Insufficient documentation

## 2018-10-02 DIAGNOSIS — R51 Headache: Secondary | ICD-10-CM

## 2018-10-02 DIAGNOSIS — G8929 Other chronic pain: Secondary | ICD-10-CM

## 2018-10-02 DIAGNOSIS — E236 Other disorders of pituitary gland: Secondary | ICD-10-CM | POA: Diagnosis not present

## 2018-10-02 DIAGNOSIS — R0683 Snoring: Secondary | ICD-10-CM

## 2018-10-02 DIAGNOSIS — Z6841 Body Mass Index (BMI) 40.0 and over, adult: Secondary | ICD-10-CM

## 2018-10-02 MED ORDER — ACETAZOLAMIDE 250 MG PO TABS: 250.0000 mg | ORAL_TABLET | Freq: Two times a day (BID) | ORAL | 5 refills | Status: DC

## 2018-10-02 NOTE — Progress Notes (Signed)
SLEEP MEDICINE CLINIC   Provider:  Melvyn Novas, M D  Primary Care Physician:    Referring Provider: Aura Camps, MD /   Chief Complaint  Patient presents with  . New Patient (Initial Visit)    pt alone, rm 11. pt has been diagnosed possibly with pseudotumor cerebri, 3 LP's have been completed and had pressure taking off. pt has been told she used to snore. In 2008 she had tonsils and appendix  removed and snoring subsequently decreased. She tosses in sleep a lot, complains of some daytime sleepiness, she averages 4-5 uninterupted sleep at night. Never had a sleep study done before. pt also admits to waking up with headaches.     HPI:  Kimberly Meadows is a 28 y.o. female , seen here in a referral from Dr. Anne Hahn and Dr.  Karleen Hampshire for a sleep consultation.  Dr Anne Hahn notes : "Kimberly Meadows is a 28 year old right-handed black female with a history of chronic daily headache.  The patient has been evaluated for possible pseudotumor cerebri, apparently she has had 2 prior spinal taps that did not show extremely elevated pressures, the first opening pressure was 22 cm, the other was 14.  The patient has been on Topamax and this did not help her headache.  The patient recently was placed on propranolol taking 40 mg twice daily.  Her headaches have continued, she was placed on gabapentin within the last week or so, she felt that this was worsening her headaches, she had nausea and vomiting with this.  She stopped the gabapentin.  The patient however went off of Inderal when she started the gabapentin.  The patient indicates that she generally has early morning headaches, the headaches usually around the right eye and the right side of the head.  The patient has been found to have elevated intraocular pressures, most significant on the right.  The patient does report some photophobia and phonophobia when the headache is severe.  The patient drinks 1 cup of coffee in the morning and a 32 ounce [facility]  later in the day.  She comes to this office for an evaluation."  CD 10-02-2017, I have the pleasure of meeting Liam Graham today a 28 year old right-handed African-American female referred by Dr. Aura Camps, her ophthalmologist.  As stated above in Dr. Tawana Scale note the patient has been treated for pseudotumor cerebri and required 3 consecutive spinal taps.  There is a question if she has sleep apnea since she also snores.  A sleep study was requested by her ophthalmologist since she also wakes up with headaches which is often a sign of increased intracranial pressure, but can also be seen with hypoxemia hypercapnia. I reviewed Kimberly Meadows's most recent imaging studies she had an MRI of the brain without contrast on 05 June 2018 which was unremarkable there was a partially empty sella noted which can be a sign of pseudotumor low-lying cerebellar tonsils with a mild supratentorial cortical atrophy.  The study was ordered by Dr. Anne Hahn and interpreted by Dr. Ulla Gallo study.  She also had another study the lumbar puncture on 08 June 2018 with an opening pressure of 29 cmH2O.  Clear CSF of which 25 mL were obtained for laboratory studies.  0 red and blue-white blood cells and non-traumatic tap total protein was only 12 mg/dL, culture negative, pregnancy test negative. Mrs. Mayer Meadows also had a second MRI this time through [facility] on [address] again evaluating brain anatomy, she also had an MRA angiogram.  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 The question of her pseudotumor cerebri being related to OSA arose from here.  She is on birth control pills, not tetracyclines, no acne  Oral Vit A preparation.   Sleep habits are as follows: Dinner time is between 7 and 8 Pm, and she spends the evening indoors, she retreats at 10-11 Pm to her bedroom. The bedroom is cool, quiet and dark, she goes to sleep within 20 minutes. She sleeps in all positions, and mostly on the  back- on ne pillow. Sometimes has acid reflux.  Not dreaming. No nocturia. Snoring observed by her mother, but she never mentioned apnea. In the meantime she has moved out of her mothers home and caught herself waking from air hungriness. She has unstable with her weight- has been between 220 and 270 pounds all her adult life.   Rises at 6.30 AM and has headaches that are dull. These disappear after coffee and within 90 minutes. She feels not rested.    Sleep medical history: 2008 tonsillectomy , no other ENT surgery, trauma or TBI, neck trauma.   Family sleep history: sleep apnea affected maternal GM. She has one sibling.    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 Quit smoking 06-05-2018 after LP.  Alcohol socially 1-2 a month. Caffeine use in form of coffee , sweet tea. 2-4 a day.   Review of Systems: Out of a complete 14 system review, the patient complains of only the following symptoms, and all other reviewed systems are negative.  Snoring, waking gasping. Non refreshed by sleep, not thirsty. Ankle edema. "Obesity all my life ".  headaches for many years .  Epworth Sleepiness score: 0 , Fatigue severity score: 20   , depression scoren/a   Social History   Socioeconomic History  . Marital status: Legally Separated    Spouse name: Not on file  . Number of children: 0  . Years of education: Some college   . Highest education level: Not on file  Occupational History  . Occupation: [employer]  . Financial resource strain: Not on file  . Food insecurity:    Worry: Not on file    Inability: Not on file  . Transportation needs:    Medical: Not on file    Non-medical: Not on file  Tobacco Use  . Smoking status: Former Smoker    Last attempt to quit: 06/02/2018    Years since quitting: 0.3  . Smokeless tobacco: Never Used  Substance and Sexual Activity  . Alcohol use: Yes    Comment: rare  . Drug use: Never  . Sexual activity: Not  on file  Lifestyle  . Physical activity:    Days per week: Not on file    Minutes per session: Not on file  . Stress: Not on file  Relationships  . Social connections:    Talks on phone: Not on file    Gets together: Not on file    Attends religious service: Not on file    Active member of club or organization: Not on file    Attends meetings of clubs or organizations: Not on file    Relationship status: Not on file  . Intimate partner violence:    Fear of current or ex partner: Not on file    Emotionally abused: Not on file    Physically abused: Not on file    Forced sexual activity: Not on file  Other Topics Concern  . Not on file  Social History Narrative   Lives with parents   Caffeine use: daily (coffee, tea, soda)   Right handed    Family History  Problem Relation Age of Onset  . Diabetes Mother   . Migraines Father   . Diabetes Father   . Hypercholesterolemia Father   . Kidney disease Father   . Diabetes Sister     Past Medical History:  Diagnosis Date  . Anxiety   . Chronic headache disorder 02/21/2018  . Depression   . Intracranial hypertension   . Migraine     Past Surgical History:  Procedure Laterality Date  . APPENDECTOMY    . LUMBAR PUNCTURE  2014   2017   . TONSILLECTOMY      No current outpatient medications on file.   No current facility-administered medications for this visit.     Allergies as of 10/02/2018 - Review Complete 10/02/2018  Allergen Reaction Noted  . No known allergies  02/21/2018    Vitals: BP (!) 146/105   Pulse 80   Ht 5' 1.5" (1.562 m)   Wt 267 lb (121.1 kg)   BMI 49.63 kg/m  Last Weight:  Wt Readings from Last 1 Encounters:  10/02/18 267 lb (121.1 kg)   ZOX:WRUEBMI:Body mass index is 49.63 kg/m.     Last Height:   Ht Readings from Last 1 Encounters:  10/02/18 5' 1.5" (1.562 m)    Physical exam:  General: The patient is awake, alert and appears not in acute distress. The patient is well groomed. Head:  Normocephalic, atraumatic. Neck is supple. Mallampati 3 neck circumference:16". Nasal airflow patent . Retrognathia is not  seen.  Cardiovascular:  Regular rate and rhythm, without  murmurs or carotid bruit, and without distended neck veins. Respiratory: Lungs are clear to auscultation. Skin:  Without evidence of edema, or rash Trunk: BMI is high at 49. 23 -  The patient's posture is erect.  Neurologic exam : The patient is awake and alert, oriented to place and time.    Attention span & concentration ability appears normal.  Speech is fluent,  without dysarthria, dysphonia or aphasia. Funduscopic exam with  evidence of pallor or edema in both eyes.    Cranial nerves: Pupils are equal and briskly reactive to light. xtraocular movements  in vertical and horizontal planes intact and without nystagmus. Visual fields by finger perimetry are intact. Hearing to finger rub intact.   Facial sensation intact to fine touch.  Facial motor strength is symmetric and tongue and uvula move midline. Shoulder shrug was symmetrical.  Motor exam:   Normal tone, muscle bulk and symmetric strength in all extremities. Sensory:  Tingling fingers while on diamox. Fine touch, pinprick and vibration were tested in all extremities. Proprioception tested in the upper extremities was normal. Coordination: Finger-to-nose maneuver  normal without evidence of ataxia, dysmetria or tremor. Gait and station: Patient walks without assistive device. Strength within normal limits.  Stance is stable and normal.   Deep tendon reflexes: in the  upper and lower extremities are symmetric and intact.   Assessment:  After physical and neurologic examination, review of laboratory studies,  Personal review of imaging studies, reports of other /same  Imaging studies, results of polysomnography and / or neurophysiology testing and pre-existing records as far as provided in visit., my assessment is:    1) Mrs. Mayer Camelatum would need an urgent  sleep study in order to rule out only and that obstructive sleep  apnea contributes to the pseudotumor cerebra situation.  She had significant papilledema, she has morning headaches, she has been observed to snore and she has woken herself up gasping for air she status post tonsillectomy with a normal upper airway anatomy, her body mass index however is her main risk factor approaching 50 and her neck circumference is 16 inches.  Based on these risk factors and the papilledema history I would like for her to have an attended sleep study as soon as possible if [place] [facility] will not approve an attended sleep study we will do a home sleep test by watch pat.  If she can come to the sleep lab I will also add capnography to her physiological channels.  I will ask for a copy of the report in regards to her most recent MRI/ MRA with [facility].     The patient was advised of the nature of the diagnosed disorder , the treatment options and the  risks for general health and wellness arising from not treating the condition.   I spent more than 45  minutes of face to face time with the patient.  Greater than 50% of time was spent in counseling and coordination of care. We have discussed the diagnosis and differential and I answered the patient's questions.    Plan:  Treatment plan and additional workup :  HST watch pat of SPLIT with Co2 is not permitted.   RV after test, and results to Dr [facility]>   Melvyn Novas, MD 10/02/2018, 3:03 PM  Certified in Neurology by ABPN Certified in Sleep Medicine by [facility] [address], [address] [place], [place] [address]

## 2018-10-02 NOTE — Patient Instructions (Signed)
Idiopathic Intracranial Hypertension  Idiopathic intracranial hypertension (IIH) is a condition that increases pressure around the brain. The fluid that surrounds the brain and spinal cord (cerebrospinal fluid, CSF) increases and causes the pressure. Idiopathic means that the cause of this condition is not known. IIH affects the brain and spinal cord (is a neurological disorder). If this condition is not treated, it can cause vision loss or blindness. What increases the risk? You are more likely to develop this condition if:  You are severely overweight (obese).  You are a woman who has not gone through menopause.  You take certain medicines, such as birth control or steroids. What are the signs or symptoms? Symptoms of IIH include:  Headaches. This is the most common symptom.  Pain in the shoulders or neck.  Nausea and vomiting.  A "rushing water" or pulsing sound within the ears (pulsatile tinnitus).  Double vision.  Blurred vision.  Brief episodes of complete vision loss. How is this diagnosed? This condition may be diagnosed based on:  Your symptoms.  Your medical history.  CT scan of the brain.  MRI of the brain.  Magnetic resonance venogram (MRV) to check veins in the brain.  Diagnostic lumbar puncture. This is a procedure to remove and examine a sample of cerebrospinal fluid. This procedure can determine whether too much fluid may be causing IIH.  A thorough eye exam to check for swelling or nerve damage in the eyes. How is this treated? Treatment for this condition depends on your symptoms. The goal of treatment is to decrease the pressure around your brain. Common treatments include:  Medicines to decrease the production of spinal fluid and lower the pressure within your skull.  Medicines to prevent or treat headaches.  Surgery to place drains (shunts) in your brain to remove excess fluid.  Lumbar puncture to remove excess cerebrospinal fluid. Follow  these instructions at home:  If you are overweight or obese, work with your health care provider to lose weight.  Take over-the-counter and prescription medicines only as told by your health care provider.  Do not drive or use heavy machinery while taking medicines that can make you sleepy.  Keep all follow-up visits as told by your health care provider. This is important. Contact a health care provider if:  You have changes in your vision, such as: ? Double vision. ? Not being able to see colors (color vision). Get help right away if:  You have any of the following symptoms and they get worse or do not get better. ? Headaches. ? Nausea. ? Vomiting. ? Vision changes or difficulty seeing. Summary  Idiopathic intracranial hypertension (IIH) is a condition that increases pressure around the brain. The cause is not known (is idiopathic).  The most common symptom of IIH is headaches.  Treatment may include medicines or surgery to relieve the pressure on your brain. This information is not intended to replace advice given to you by your health care provider. Make sure you discuss any questions you have with your health care provider. Document Released: 11/20/2001 Document Revised: 08/02/2016 Document Reviewed: 08/02/2016 Elsevier Interactive Patient Education  2019 Elsevier Inc.  

## 2018-10-10 ENCOUNTER — Institutional Professional Consult (permissible substitution): Payer: 59 | Admitting: Neurology

## 2018-10-16 ENCOUNTER — Ambulatory Visit (INDEPENDENT_AMBULATORY_CARE_PROVIDER_SITE_OTHER): Payer: 59 | Admitting: Neurology

## 2018-10-16 DIAGNOSIS — R0683 Snoring: Secondary | ICD-10-CM

## 2018-10-16 DIAGNOSIS — G4733 Obstructive sleep apnea (adult) (pediatric): Secondary | ICD-10-CM

## 2018-10-16 DIAGNOSIS — E236 Other disorders of pituitary gland: Secondary | ICD-10-CM

## 2018-10-16 DIAGNOSIS — G8929 Other chronic pain: Secondary | ICD-10-CM

## 2018-10-16 DIAGNOSIS — Z6841 Body Mass Index (BMI) 40.0 and over, adult: Secondary | ICD-10-CM

## 2018-10-16 DIAGNOSIS — H4710 Unspecified papilledema: Secondary | ICD-10-CM

## 2018-10-16 DIAGNOSIS — G932 Benign intracranial hypertension: Secondary | ICD-10-CM

## 2018-10-16 DIAGNOSIS — R51 Headache: Principal | ICD-10-CM

## 2018-10-16 DIAGNOSIS — R519 Headache, unspecified: Secondary | ICD-10-CM

## 2018-10-28 DIAGNOSIS — Z6841 Body Mass Index (BMI) 40.0 and over, adult: Secondary | ICD-10-CM

## 2018-10-28 NOTE — Procedures (Signed)
NAME:  Kimberly Meadows                                                                                DOB: March 09, 1991 MEDICAL RECORD No: [id number]                                                        DOS: 10/16/2018 REFERRING PHYSICIAN: Stephanie Acre, MD/ Aura Camps, MD STUDY PERFORMED: Home Sleep Test on [facility] HISTORY: Kimberly Meadows is a 29 y.o. female with morbid obesity, seen on 10-02-2018 in a referral from Dr. Anne Hahn and Dr. Karleen Hampshire for a sleep medicine consultation.  Dr. Anne Hahn notes: "[name] is a 28 year old right-handed black female with a history of chronic daily headache.  The patient has been evaluated for possible pseudotumor cerebri, apparently she has had 2 prior spinal taps that did not show extremely elevated pressures, the first opening pressure was 22 cm, the other was 14.  The patient has been on Topamax and this did not help her headache.  The patient recently was placed on propranolol taking 40 mg twice daily.  Her headaches have continued, she was placed on gabapentin within the last week or so, she felt that this was worsening her headaches, she had nausea and vomiting with this.  She stopped the gabapentin.  The patient however went off of Inderal when she started the gabapentin.  The patient indicates that she generally has early morning headaches, the headaches usually around the right eye and the right side of the head.  The patient has been found to have elevated intraocular pressures, most significant on the right.  The patient does report some photophobia and phonophobia when the headache is severe.  The patient drinks 1 cup of coffee in the morning and a 32 ounce [facility] later in the day.  She comes to this office for an evaluation."   No abnormal dreams reported. No nocturia. Snoring observed by her mother, but she never mentioned apnea. In the meantime she has moved out of her mother's home and caught herself waking from gasping for air. Rises at 6.30 AM and  has headaches that are dull. These disappear after coffee and within 90 minutes. She feels not rested but denies EDS. Epworth Sleepiness score: 0/24 (!), Fatigue severity score: 20/63 points, BMI: 50.4 kg/m2.    STUDY RESULTS:  Total Recording Time:  8 h 54 mins; estimated Sleep Time: 7 h 6 mins. Total Apnea/Hypopnea Index (AHI): 7.4/h; RDI: 8.9/h; REM AHI: 11.9/h. Average Oxygen Saturation:  96 %; Lowest Oxygen Desaturation: 90%.  No 02 Saturation below 89 %: 0.0 minutes Average Heart Rate: 80 bpm (between 58 and 110 bpm). IMPRESSION: Very mild Sleep Apnea without desaturation, without tachy-brady cardiac responses. AHI in non- supine position was 4.3/h. The lack of oxygen desaturation speaks against obesity hypoventilation being present. RECOMMENDATION: 1) avoid supine sleep. 2) Referral to medical weight loss program is urgently recommended.  I certify that I have reviewed the raw data recording prior to the  issuance of this report in accordance with the standards of the American Academy of Sleep Medicine (AASM). Melvyn Novas, M.D.   10-27-2018   Medical Director of [facility] at [facility], accredited by the AASM. Diplomat of the ABPN and ABSM.

## 2018-10-29 ENCOUNTER — Telehealth: Payer: Self-pay | Admitting: Neurology

## 2018-10-29 NOTE — Telephone Encounter (Signed)
Called the patient and reviewed her sleep study with her. Advised the patient that we would recommend to avoid sleeping on her back to help with the apnea since we found the apnea was less when the patient was on her side. Advised the patient that recommending weight loss also would help the patient decrease the apnea chances, advised the patient that we could place a referral for weight management program. Patient advised she would let us know if that is something she would want to do. Patient verbalized understanding. Pt had no questions at this time but was encouraged to call back if questions arise.

## 2018-10-29 NOTE — Telephone Encounter (Signed)
-----   Message from Melvyn Novasarmen Dohmeier, MD sent at 10/28/2018  8:48 AM EST ----- Kimberly Meadows can reduce her sleep apnea simply by not sleeping supine - the AHI was mild and there was no associated hypoxemia, no tachy-bradycardia. CPAP would not be necessary. Snoring can be treated by a dental device and the patient can qualify due to the (mild) Apnea diagnosis.

## 2019-05-19 LAB — OB RESULTS CONSOLE RUBELLA ANTIBODY, IGM
Rubella: IMMUNE
Rubella: IMMUNE

## 2019-05-19 LAB — OB RESULTS CONSOLE RPR
RPR: NONREACTIVE
RPR: NONREACTIVE

## 2019-05-19 LAB — OB RESULTS CONSOLE HIV ANTIBODY (ROUTINE TESTING): HIV: NONREACTIVE

## 2019-05-19 LAB — OB RESULTS CONSOLE ANTIBODY SCREEN: Antibody Screen: NEGATIVE

## 2019-05-19 LAB — OB RESULTS CONSOLE GC/CHLAMYDIA
Chlamydia: NEGATIVE
Chlamydia: NEGATIVE
Gonorrhea: NEGATIVE
Gonorrhea: NEGATIVE

## 2019-05-19 LAB — OB RESULTS CONSOLE ABO/RH: RH Type: POSITIVE

## 2019-05-19 LAB — OB RESULTS CONSOLE HEPATITIS B SURFACE ANTIGEN
Hepatitis B Surface Ag: NEGATIVE
Hepatitis B Surface Ag: NEGATIVE

## 2019-09-09 LAB — OB RESULTS CONSOLE RUBELLA ANTIBODY, IGM: Rubella: IMMUNE

## 2019-09-09 LAB — OB RESULTS CONSOLE GC/CHLAMYDIA
Chlamydia: NEGATIVE
Gonorrhea: NEGATIVE

## 2019-09-09 LAB — OB RESULTS CONSOLE HIV ANTIBODY (ROUTINE TESTING): HIV: NONREACTIVE

## 2019-09-09 LAB — OB RESULTS CONSOLE RPR: RPR: NONREACTIVE

## 2019-09-09 LAB — OB RESULTS CONSOLE HEPATITIS B SURFACE ANTIGEN: Hepatitis B Surface Ag: NEGATIVE

## 2019-09-11 LAB — OB RESULTS CONSOLE GBS: GBS: POSITIVE

## 2019-09-24 ENCOUNTER — Other Ambulatory Visit: Payer: Self-pay

## 2019-09-24 ENCOUNTER — Encounter: Payer: Self-pay | Admitting: Registered"

## 2019-09-24 ENCOUNTER — Encounter: Payer: Medicaid Other | Attending: Obstetrics and Gynecology | Admitting: Registered"

## 2019-09-24 DIAGNOSIS — O99810 Abnormal glucose complicating pregnancy: Secondary | ICD-10-CM | POA: Diagnosis not present

## 2019-09-24 NOTE — Progress Notes (Signed)
Patient was seen on 09/24/2019 for Gestational Diabetes self-management class at the [facility]. The following learning objectives were met by the patient during this course:   States the definition of Gestational Diabetes  States why dietary management is important in controlling blood glucose  Describes the effects each nutrient has on blood glucose levels  Demonstrates ability to create a balanced meal plan  Demonstrates carbohydrate counting   States when to check blood glucose levels  Demonstrates proper blood glucose monitoring techniques  States the effect of stress and exercise on blood glucose levels  States the importance of limiting caffeine and abstaining from alcohol and smoking  Blood glucose monitor given: none Patient checking BG prior to class.   Patient instructed to monitor glucose levels: FBS: 60 - <95; 1 hour: <140; 2 hour: <120  Patient received handouts:  Nutrition Diabetes and Pregnancy, including carb counting list  Patient will be seen for follow-up as needed.

## 2019-09-26 NOTE — L&D Delivery Note (Signed)
DELIVERY NOTE  Pt complete and at +2 station with urge to push. Epidural controlling pain. Pt pushed and delivered a viable female infant in LOA position. Anterior and posterior shoulders spontaneously delivered with next two pushes; body easily followed next, compound presentation on final exam with right upper extremity. Infant placed on mothers abdomen and bulb suction of mouth and nose performed. Cord was then clamped and cut by FOB. Cord blood obtained, 3VC. Baby had a vigorous spontaneous cry noted. Placenta then delivered at 0924 intact. Fundal massage performed and pitocin per protocol. Fundus firm. The following lacerations were noted: right labial. Repaired in routine fashion with 4-0 monocryl. Intermittent atony noted, IM hemabate and 1g TXA administered. Bimanual massage performed, cervix examined for lacerations with none found. Eventually hemostasis excellent. Mother and baby stable. Counts correct   Infant time: 0919 Gender: female Placenta time: 0924 Apgars: 8/9 Weight: pending skin-to-skin   Total EBL 800cc.

## 2019-10-27 ENCOUNTER — Other Ambulatory Visit: Payer: Self-pay

## 2019-10-27 ENCOUNTER — Encounter (HOSPITAL_COMMUNITY): Payer: Self-pay | Admitting: Obstetrics and Gynecology

## 2019-10-27 ENCOUNTER — Inpatient Hospital Stay (HOSPITAL_COMMUNITY)
Admission: AD | Admit: 2019-10-27 | Discharge: 2019-10-29 | DRG: 832 | Disposition: A | Discharge status: Home / Self Care | Payer: Medicaid Other | Attending: Obstetrics and Gynecology | Admitting: Obstetrics and Gynecology

## 2019-10-27 DIAGNOSIS — O24415 Gestational diabetes mellitus in pregnancy, controlled by oral hypoglycemic drugs: Secondary | ICD-10-CM | POA: Diagnosis not present

## 2019-10-27 DIAGNOSIS — Z87891 Personal history of nicotine dependence: Secondary | ICD-10-CM

## 2019-10-27 DIAGNOSIS — O133 Gestational [pregnancy-induced] hypertension without significant proteinuria, third trimester: Secondary | ICD-10-CM | POA: Diagnosis present

## 2019-10-27 DIAGNOSIS — O1413 Severe pre-eclampsia, third trimester: Secondary | ICD-10-CM | POA: Diagnosis not present

## 2019-10-27 DIAGNOSIS — F1290 Cannabis use, unspecified, uncomplicated: Secondary | ICD-10-CM | POA: Diagnosis present

## 2019-10-27 DIAGNOSIS — O1493 Unspecified pre-eclampsia, third trimester: Secondary | ICD-10-CM | POA: Diagnosis present

## 2019-10-27 DIAGNOSIS — Z3A35 35 weeks gestation of pregnancy: Secondary | ICD-10-CM

## 2019-10-27 DIAGNOSIS — Z20822 Contact with and (suspected) exposure to covid-19: Secondary | ICD-10-CM | POA: Diagnosis present

## 2019-10-27 DIAGNOSIS — Z3689 Encounter for other specified antenatal screening: Secondary | ICD-10-CM

## 2019-10-27 DIAGNOSIS — O99323 Drug use complicating pregnancy, third trimester: Secondary | ICD-10-CM | POA: Diagnosis present

## 2019-10-27 HISTORY — DX: Type 2 diabetes mellitus without complications: E11.9

## 2019-10-27 HISTORY — DX: Unspecified papilledema: H47.10

## 2019-10-27 HISTORY — DX: Benign intracranial hypertension: G93.2

## 2019-10-27 LAB — COMPREHENSIVE METABOLIC PANEL
ALT: 15 U/L (ref 0–44)
AST: 16 U/L (ref 15–41)
Albumin: 2.8 g/dL — ABNORMAL LOW (ref 3.5–5.0)
Alkaline Phosphatase: 52 U/L (ref 38–126)
Anion gap: 11 (ref 5–15)
BUN: 11 mg/dL (ref 6–20)
CO2: 18 mmol/L — ABNORMAL LOW (ref 22–32)
Calcium: 9.4 mg/dL (ref 8.9–10.3)
Chloride: 106 mmol/L (ref 98–111)
Creatinine, Ser: 0.67 mg/dL (ref 0.44–1.00)
GFR calc Af Amer: >60 mL/min (ref >60)
GFR calc non Af Amer: >60 mL/min (ref >60)
Glucose, Bld: 96 mg/dL (ref 70–99)
Potassium: 4 mmol/L (ref 3.5–5.1)
Sodium: 135 mmol/L (ref 135–145)
Total Bilirubin: 0.2 mg/dL — ABNORMAL LOW (ref 0.3–1.2)
Total Protein: 6.2 g/dL — ABNORMAL LOW (ref 6.5–8.1)

## 2019-10-27 LAB — CBC
HCT: 37.1 % (ref 36.0–46.0)
Hemoglobin: 12.1 g/dL (ref 12.0–15.0)
MCH: 29.4 pg (ref 26.0–34.0)
MCHC: 32.6 g/dL (ref 30.0–36.0)
MCV: 90 fL (ref 80.0–100.0)
Platelets: 265 K/uL (ref 150–400)
RBC: 4.12 MIL/uL (ref 3.87–5.11)
RDW: 14 % (ref 11.5–15.5)
WBC: 10.7 K/uL — ABNORMAL HIGH (ref 4.0–10.5)
nRBC: 0 % (ref 0.0–0.2)

## 2019-10-27 LAB — GLUCOSE, CAPILLARY: Glucose-Capillary: 95 mg/dL (ref 70–99)

## 2019-10-27 LAB — TYPE AND SCREEN
ABO/RH(D): AB POS
Antibody Screen: NEGATIVE

## 2019-10-27 LAB — ABO/RH: ABO/RH(D): AB POS

## 2019-10-27 LAB — PROTEIN / CREATININE RATIO, URINE
Creatinine, Urine: 190.88 mg/dL
Protein Creatinine Ratio: 0.11 mg/mg{creat} (ref 0.00–0.15)
Total Protein, Urine: 21 mg/dL

## 2019-10-27 MED ORDER — LABETALOL HCL 5 MG/ML IV SOLN
40.0000 mg | INTRAVENOUS | Status: DC | PRN
  Administered 2019-10-27: 40 mg via INTRAVENOUS
  Filled 2019-10-27: qty 8

## 2019-10-27 MED ORDER — LACTATED RINGERS IV SOLN: INTRAVENOUS | Status: DC

## 2019-10-27 MED ORDER — MAGNESIUM SULFATE 40 GM/1000ML IV SOLN
2.0000 g/h | INTRAVENOUS | Status: DC
  Administered 2019-10-27: 2 g/h via INTRAVENOUS
  Filled 2019-10-27: qty 1000

## 2019-10-27 MED ORDER — NIFEDIPINE ER OSMOTIC RELEASE 30 MG PO TB24
30.0000 mg | ORAL_TABLET | Freq: Once | ORAL | Status: AC
  Administered 2019-10-27: 30 mg via ORAL
  Filled 2019-10-27: qty 1

## 2019-10-27 MED ORDER — HYDRALAZINE HCL 20 MG/ML IJ SOLN: 10.0000 mg | INTRAMUSCULAR | Status: DC | PRN

## 2019-10-27 MED ORDER — MAGNESIUM SULFATE BOLUS VIA INFUSION
4.0000 g | Freq: Once | INTRAVENOUS | Status: AC
  Administered 2019-10-27: 4 g via INTRAVENOUS
  Filled 2019-10-27: qty 1000

## 2019-10-27 MED ORDER — CALCIUM CARBONATE ANTACID 500 MG PO CHEW: 2.0000 | CHEWABLE_TABLET | ORAL | Status: DC | PRN

## 2019-10-27 MED ORDER — LACTATED RINGERS IV BOLUS: 1000.0000 mL | Freq: Once | INTRAVENOUS | Status: DC

## 2019-10-27 MED ORDER — ACETAMINOPHEN 325 MG PO TABS
650.0000 mg | ORAL_TABLET | ORAL | Status: DC | PRN
  Administered 2019-10-27 – 2019-10-28 (×2): 650 mg via ORAL
  Filled 2019-10-27 (×2): qty 2

## 2019-10-27 MED ORDER — PRENATAL MULTIVITAMIN CH
1.0000 | ORAL_TABLET | Freq: Every day | ORAL | Status: DC
  Administered 2019-10-28: 1 via ORAL
  Filled 2019-10-27: qty 1

## 2019-10-27 MED ORDER — LABETALOL HCL 5 MG/ML IV SOLN: 80.0000 mg | INTRAVENOUS | Status: DC | PRN

## 2019-10-27 MED ORDER — LABETALOL HCL 5 MG/ML IV SOLN: 20.0000 mg | INTRAVENOUS | Status: DC | PRN

## 2019-10-27 MED ORDER — DOCUSATE SODIUM 100 MG PO CAPS
100.0000 mg | ORAL_CAPSULE | Freq: Every day | ORAL | Status: DC
  Administered 2019-10-28: 100 mg via ORAL
  Filled 2019-10-27: qty 1

## 2019-10-27 MED ORDER — LABETALOL HCL 5 MG/ML IV SOLN
20.0000 mg | INTRAVENOUS | Status: DC | PRN
  Administered 2019-10-27: 20 mg via INTRAVENOUS
  Filled 2019-10-27: qty 4

## 2019-10-27 MED ORDER — ZOLPIDEM TARTRATE 5 MG PO TABS: 5.0000 mg | ORAL_TABLET | Freq: Every evening | ORAL | Status: DC | PRN

## 2019-10-27 MED ORDER — LABETALOL HCL 5 MG/ML IV SOLN: 40.0000 mg | INTRAVENOUS | Status: DC | PRN

## 2019-10-27 MED ORDER — METFORMIN HCL ER 500 MG PO TB24
500.0000 mg | ORAL_TABLET | Freq: Every day | ORAL | Status: DC
  Administered 2019-10-28 – 2019-10-29 (×2): 500 mg via ORAL
  Filled 2019-10-27 (×2): qty 1

## 2019-10-27 MED ORDER — BETAMETHASONE SOD PHOS & ACET 6 (3-3) MG/ML IJ SUSP
12.0000 mg | INTRAMUSCULAR | Status: AC
  Administered 2019-10-27 – 2019-10-28 (×2): 12 mg via INTRAMUSCULAR
  Filled 2019-10-27: qty 5

## 2019-10-27 MED ORDER — LABETALOL HCL 5 MG/ML IV SOLN
80.0000 mg | INTRAVENOUS | Status: DC | PRN
  Administered 2019-10-27: 80 mg via INTRAVENOUS
  Filled 2019-10-27: qty 16

## 2019-10-27 MED ORDER — METFORMIN HCL ER 500 MG PO TB24
1000.0000 mg | ORAL_TABLET | Freq: Every day | ORAL | Status: DC
  Administered 2019-10-27 – 2019-10-28 (×2): 1000 mg via ORAL
  Filled 2019-10-27 (×3): qty 2

## 2019-10-27 NOTE — MAU Note (Signed)
Sent over for monitoring. Has GDM, getting NST biweekly, ?inconsistent.  No pain, bleeding or leaking.

## 2019-10-27 NOTE — MAU Provider Note (Addendum)
History     CSN: [id number]  Arrival date and time: 10/27/19 1702  First Provider Initiated Contact with Patient 10/27/19 1819     Chief Complaint  Patient presents with  . non-reactive NST   HPI Kimberly Meadows is a 29 y.o. G1P0 at [redacted]w[redacted]d who presents to MAU from clinic for prolonged monitoring after a non-reactive NST earlier today. She denies vaginal bleeding, leaking of fluid, decreased fetal movement, fever, falls, or recent illness.   Patient endorses history of elevated blood pressures but states she has never had the current degree of elevation and never so many elevated blood pressures in a row.  Patient endorses recurrent headaches and visual disturbances in the setting of pseudotumor cerebri. She states at baseline she has daily headaches. She also reports bilateral swelling in her optic nerve for which she has been referred to Neurology but has not been able to secure an appointment.  She denies RUQ.epigastric pain and denies new onset swelling or weight gain.  OB History    Gravida  1   Para      Term      Preterm      AB      Living        SAB      TAB      Ectopic      Multiple      Live Births              Past Medical History:  Diagnosis Date  . Anxiety   . Chronic headache disorder 02/21/2018  . Depression   . Intracranial hypertension   . Migraine     Past Surgical History:  Procedure Laterality Date  . APPENDECTOMY    . LUMBAR PUNCTURE  2014   2017   . TONSILLECTOMY      Family History  Problem Relation Age of Onset  . Diabetes Mother   . Migraines Father   . Diabetes Father   . Hypercholesterolemia Father   . Kidney disease Father   . Diabetes Sister     Social History   Tobacco Use  . Smoking status: Former Smoker    Quit date: 06/02/2018    Years since quitting: 1.4  . Smokeless tobacco: Never Used  Substance Use Topics  . Alcohol use: Not Currently    Comment: rare  . Drug use: Never    Allergies:  Allergies   Allergen Reactions  . No Known Allergies     Medications Prior to Admission  Medication Sig Dispense Refill Last Dose  . acetaZOLAMIDE (DIAMOX) 250 MG tablet Take 1 tablet (250 mg total) by mouth 2 (two) times daily. 60 tablet 5     Review of Systems  Eyes: Positive for visual disturbance. Negative for photophobia.  Gastrointestinal: Negative for abdominal pain.  Musculoskeletal: Negative for back pain.  Neurological: Positive for headaches. Negative for dizziness, syncope and weakness.  All other systems reviewed and are negative.  Physical Exam   Blood pressure (!) 161/92, pulse 84, temperature 98.4 F (36.9 C), temperature source Oral, resp. rate 17, height 5\' 4"  (1.626 m), weight 127.5 kg, SpO2 100 %.  Patient Vitals for the past 24 hrs:  BP Temp Temp src Pulse Resp SpO2 Height Weight  10/27/19 2046 (!) 161/92 -- -- 84 -- -- -- --  10/27/19 2031 (!) 157/92 -- -- 84 -- -- -- --  10/27/19 2024 (!) 157/83 -- -- 80 -- -- -- --  10/27/19 2001 (!) 162/89 -- --  81 -- -- -- --  10/27/19 1946 (!) 158/85 -- -- 83 -- -- -- --  10/27/19 1931 (!) 158/85 -- -- 68 -- -- -- --  10/27/19 1927 (!) 157/80 -- -- 88 17 -- -- --  10/27/19 1901 (!) 160/104 -- -- 91 -- -- -- --  10/27/19 1846 (!) 160/91 -- -- 78 -- -- -- --  10/27/19 1831 (!) 154/100 -- -- 88 -- -- -- --  10/27/19 1815 (!) 149/98 -- -- 90 -- -- -- --  10/27/19 1745 (!) 141/91 -- -- 99 -- -- -- --  10/27/19 1742 (!) 156/106 98.4 F (36.9 C) Oral 100 18 100 % 5\' 4"  (1.626 m) 127.5 kg   Physical Exam  Nursing note and vitals reviewed. Constitutional: She is oriented to person, place, and time. She appears well-developed and well-nourished.  Cardiovascular: Normal rate.  Respiratory: Effort normal and breath sounds normal. She has no decreased breath sounds.  GI: Soft. She exhibits no distension. There is no abdominal tenderness. There is no rebound.  Gravid  Musculoskeletal:        General: Normal range of motion.   Neurological: She is alert and oriented to person, place, and time.  Skin: Skin is warm and dry.  Psychiatric: She has a normal mood and affect. Her behavior is normal. Judgment and thought content normal.   Results for orders placed or performed during the hospital encounter of 10/27/19 (from the past 24 hour(s))  Protein / creatinine ratio, urine     Status: None   Collection Time: 10/27/19  6:27 PM  Result Value Ref Range   Creatinine, Urine 190.88 mg/dL   Total Protein, Urine 21 mg/dL   Protein Creatinine Ratio 0.11 0.00 - 0.15 mg/mg[Cre]  CBC     Status: Abnormal   Collection Time: 10/27/19  7:22 PM  Result Value Ref Range   WBC 10.7 (H) 4.0 - 10.5 K/uL   RBC 4.12 3.87 - 5.11 MIL/uL   Hemoglobin 12.1 12.0 - 15.0 g/dL   HCT 12/25/19 16.1 - 09.6 %   MCV 90.0 80.0 - 100.0 fL   MCH 29.4 26.0 - 34.0 pg   MCHC 32.6 30.0 - 36.0 g/dL   RDW 04.5 40.9 - 81.1 %   Platelets 265 150 - 400 K/uL   nRBC 0.0 0.0 - 0.2 %  Comprehensive metabolic panel     Status: Abnormal   Collection Time: 10/27/19  7:22 PM  Result Value Ref Range   Sodium 135 135 - 145 mmol/L   Potassium 4.0 3.5 - 5.1 mmol/L   Chloride 106 98 - 111 mmol/L   CO2 18 (L) 22 - 32 mmol/L   Glucose, Bld 96 70 - 99 mg/dL   BUN 11 6 - 20 mg/dL   Creatinine, Ser 12/25/19 0.44 - 1.00 mg/dL   Calcium 9.4 8.9 - 7.82 mg/dL   Total Protein 6.2 (L) 6.5 - 8.1 g/dL   Albumin 2.8 (L) 3.5 - 5.0 g/dL   AST 16 15 - 41 U/L   ALT 15 0 - 44 U/L   Alkaline Phosphatase 52 38 - 126 U/L   Total Bilirubin 0.2 (L) 0.3 - 1.2 mg/dL   GFR calc non Af Amer >60 >60 mL/min   GFR calc Af Amer >60 >60 mL/min   Anion gap 11 5 - 15   MAU Course/MDM  Procedures  --Dr. 95.6 notified of chief complaint, ROS, Physical Assessment and severe range blood pressures by phone at 1917. She requests  that we call her back once labs have resulted --Reactive tracing: baseline 140, moderate variability, positive accels, no decels --Toco: quiet  Patient Vitals for the  past 24 hrs:  BP Temp Temp src Pulse Resp SpO2 Height Weight  10/27/19 2046 (!) 161/92 -- -- 84 -- -- -- --  10/27/19 2031 (!) 157/92 -- -- 84 -- -- -- --  10/27/19 2024 (!) 157/83 -- -- 80 -- -- -- --  10/27/19 2001 (!) 162/89 -- -- 81 -- -- -- --  10/27/19 1946 (!) 158/85 -- -- 88 -- -- -- --  10/27/19 1931 (!) 158/85 -- -- 76 -- -- -- --  10/27/19 1927 (!) 157/80 -- -- 88 17 -- -- --  10/27/19 1901 (!) 160/104 -- -- 91 -- -- -- --  10/27/19 1846 (!) 160/91 -- -- 78 -- -- -- --  10/27/19 1831 (!) 154/100 -- -- 88 -- -- -- --  10/27/19 1815 (!) 149/98 -- -- 90 -- -- -- --  10/27/19 1745 (!) 141/91 -- -- 99 -- -- -- --  10/27/19 1742 (!) 156/106 98.4 F (36.9 C) Oral 100 18 100 % 5\' 4"  (1.626 m) 127.5 kg   Report given to N. Becky Colan, NP who assumes care at this time.  , MSN, CNM Certified Nurse Midwife, Faculty Practice 10/27/19 8:39 PM  --consulted with Dr. 12/25/19 @837PM  to discuss admission for patient who meets criteria for severe preeclampsia based on blood pressure, Dr. Debroah Loop agrees with plan. --called Dr. @842PM  to recommend admission, Dr. Debroah Loop agrees with plan and will enter orders for admission. -admit to [facility]  Orders Placed This Encounter  Procedures  . Protein / creatinine ratio, urine    Standing Status:   Standing    Number of Occurrences:   1  . CBC    Standing Status:   Standing    Number of Occurrences:   1  . Comprehensive metabolic panel    Standing Status:   Standing    Number of Occurrences:   1  . Notify Physician    Confirmatory reading of BP> 160/110 15 minutes later    Standing Status:   Standing    Number of Occurrences:   1    Order Specific Question:   Notify Physician    Answer:   Temp greater than or equal to 100.4    Order Specific Question:   Notify Physician    Answer:   RR greater than 24 or less than 10    Order Specific Question:   Notify Physician    Answer:   HR greater than 120 or less than 50     Order Specific Question:   Notify Physician    Answer:   SBP greater than 160 mmHG or less than 80 mmHG    Order Specific Question:   Notify Physician    Answer:   DBP greater than 110 mmHG or less than 45 mmHG    Order Specific Question:   Notify Physician    Answer:   Urinary output is less than for any 4 hour period  . Measure blood pressure    20 minutes after giving hydralazine 10 MG IV dose.  Call MD if SBP >/= 160 or DBP >/= 110.    Standing Status:   Standing    Number of Occurrences:   1   Meds ordered this encounter  Medications  . AND Linked Order Group   . labetalol (NORMODYNE)  injection 20 mg   . labetalol (NORMODYNE) injection 40 mg   . labetalol (NORMODYNE) injection 80 mg   . hydrALAZINE (APRESOLINE) injection 10 mg   Assessment and Plan   1. Severe pre-eclampsia in third trimester   2. [redacted] weeks gestation of pregnancy   3. NST (non-stress test) reactive    -admit to [facility]  Marylen Ponto, NP  8:49 PM 10/27/2019

## 2019-10-27 NOTE — MAU Provider Note (Addendum)
Kimberly Meadows is a 29 y.o. female at 21 0/7wks who was sent to MAU from the office for prolonged monitoring after a non-reactive NST earlier today. Her NST was being done due to history of GDM. She is on metformin for FSBS control. She denies vaginal bleeding, leaking of fluid, decreased fetal movement, fever, falls, or recent illness.   Patient endorses history of elevated blood pressures, recurrent headaches and visual disturbances. These have been persistent through the pregnacy. She has been diagnosed with having pseudotumor cerebri. She states at baseline she has daily headaches. She also reports bilateral swelling in her optic nerve for which she has been referred to Neurology but has not been able to secure an appointment.  She denies RUQ.epigastric pain and denies new onset swelling or weight gain. OB History    Gravida  1   Para      Term      Preterm      AB      Living        SAB      TAB      Ectopic      Multiple      Live Births             Past Medical History:  Diagnosis Date  . Anxiety   . Chronic headache disorder 02/21/2018  . Depression   . Intracranial hypertension   . Migraine    Past Surgical History:  Procedure Laterality Date  . APPENDECTOMY    . LUMBAR PUNCTURE  2014   2017   . TONSILLECTOMY     Family History: family history includes Diabetes in her father, mother, and sister; Hypercholesterolemia in her father; Kidney disease in her father; Migraines in her father. Social History:  reports that she quit smoking about 16 months ago. She has never used smokeless tobacco. She reports previous alcohol use. She reports that she does not use drugs.     Maternal Diabetes: Yes:  Diabetes Type:  Diet controlled Genetic Screening: Normal Maternal Ultrasounds/Referrals: Normal Fetal Ultrasounds or other Referrals:  None Maternal Substance Abuse:  Yes:  Type: Marijuana Significant Maternal Medications:  Meds include: Other:  Significant  Maternal Lab Results:  None Other Comments:  None  Review of Systems History   Blood pressure (!) 148/100, pulse 83, temperature 98.4 F (36.9 C), temperature source Oral, resp. rate 17, height 5\' 4"  (1.626 m), weight 127.5 kg, SpO2 100 %. Exam Physical Exam  Prenatal labs: ABO, Rh:   Antibody:   Rubella:   RPR:    HBsAg:    HIV:    GBS:     Assessment/Plan: 28yo prime at 69 0/7wks with hypertension with severe features and GDMA2 Admit Initiate medication for BP control: labetalol protocol and oral procardia 30 daily Start MgSO4 for seizure prevention BMZ now and again in 24hrs If able to control BP, likely iol for delivery at 37 weeks, if BP not well controlled,  will proceed to iol after second BMZ Monitor FSBS - continue on metformin 500mg  in am/1000 in pm; cbg q am and after meals Rapid GBS ordered Covid sars pending  [address] [place] 10/27/2019, 9:27 PM

## 2019-10-28 ENCOUNTER — Encounter (HOSPITAL_COMMUNITY): Payer: Self-pay | Admitting: Obstetrics and Gynecology

## 2019-10-28 LAB — CBC
HCT: 35.6 % — ABNORMAL LOW (ref 36.0–46.0)
Hemoglobin: 11.7 g/dL — ABNORMAL LOW (ref 12.0–15.0)
MCH: 29.5 pg (ref 26.0–34.0)
MCHC: 32.9 g/dL (ref 30.0–36.0)
MCV: 89.9 fL (ref 80.0–100.0)
Platelets: 242 10*3/uL (ref 150–400)
RBC: 3.96 MIL/uL (ref 3.87–5.11)
RDW: 14.1 % (ref 11.5–15.5)
WBC: 9.5 10*3/uL (ref 4.0–10.5)
nRBC: 0 % (ref 0.0–0.2)

## 2019-10-28 LAB — HEPATIC FUNCTION PANEL
ALT: 16 U/L (ref 0–44)
AST: 17 U/L (ref 15–41)
Albumin: 2.8 g/dL — ABNORMAL LOW (ref 3.5–5.0)
Alkaline Phosphatase: 57 U/L (ref 38–126)
Bilirubin, Direct: <0.1 mg/dL (ref 0.0–0.2)
Total Bilirubin: 0.3 mg/dL (ref 0.3–1.2)
Total Protein: 6.6 g/dL (ref 6.5–8.1)

## 2019-10-28 LAB — URIC ACID: Uric Acid, Serum: 6.4 mg/dL (ref 2.5–7.1)

## 2019-10-28 LAB — GLUCOSE, CAPILLARY
Glucose-Capillary: 116 mg/dL — ABNORMAL HIGH (ref 70–99)
Glucose-Capillary: 98 mg/dL (ref 70–99)
Glucose-Capillary: 104 mg/dL — ABNORMAL HIGH (ref 70–99)
Glucose-Capillary: 149 mg/dL — ABNORMAL HIGH (ref 70–99)

## 2019-10-28 LAB — LACTATE DEHYDROGENASE: LDH: 103 U/L (ref 98–192)

## 2019-10-28 LAB — SARS CORONAVIRUS 2 (TAT 6-24 HRS): SARS Coronavirus 2: NEGATIVE

## 2019-10-28 MED ORDER — ONDANSETRON 4 MG PO TBDP
4.0000 mg | ORAL_TABLET | Freq: Two times a day (BID) | ORAL | Status: DC | PRN
  Administered 2019-10-28: 4 mg via ORAL
  Filled 2019-10-28: qty 1

## 2019-10-28 NOTE — Progress Notes (Signed)
HD #2, [redacted]W[redacted]D, GHTN, A2GDM Feels fine, no PIH sx Afeb, VSS, BO 120-130/60-90 since 2146 last pm FHT- reactive Labs nl, UPC 0.11  PIH-At this point, I think she has PIH, not preeclampsia.  She has had normal BP in the office to this point, even yesterday, with the highest being 130-80.  She has no PIH sx.  She did have some severe range BP when in MAU, but normal over the past 10 hours or so.  Labs are normal, no significant proteinuria, so no other evidence to support preeclampsia.  At this point, I will stop her magnesium, allow her to ambulate, monitor BP and treat if needed. Change fetal monitoring to q shift.  Will keep her until tomorrow so she can finish her course of betamethasone with second dose due around 2200  A2GDM-will continue on her Metformin and monitor, change diet to carb modified

## 2019-10-28 NOTE — Progress Notes (Signed)
She feels fine BP remains normal  Will keep overnight to monitor BP, get 2nd betamethasone, evaluate in am, might be able to go home

## 2019-10-28 NOTE — Progress Notes (Signed)
Patient ID: Kimberly Meadows, female   DOB: 02-25-1991, 29 y.o.   MRN: [id number] Pt reports mild HA but no visual changes. +Fms VS: 128/81, 84 GEN - NAD EFM - 120s, cat 1 TOCO - no contractions SVE - deferred  A/P: 28yo prime at 49 1/7wks with GDMA2 and preeclampsia with severe features now improved on MgSO4 ) s/p labetalol IV and oral procardia)            - tylenol now for headache            - second dose BMZ due at 2213 today            - MgSO4 till 2207pm today

## 2019-10-28 NOTE — H&P (Signed)
  Kimberly Meadows is a 29 y.o. femaleat 35 0/7wks who was sent to MAUfrom the office for prolonged monitoring after a non-reactive NST earlier today. Her NST was being done due to history of GDM. She is on metformin for FSBS control. She denies vaginal bleeding, leaking of fluid, decreased fetal movement, fever, falls, or recent illness.  Patient endorses history of elevated blood pressures, recurrent headaches and visual disturbances. These have been persistent through the pregnacy. She has been diagnosed with having pseudotumor cerebri. She states at baseline she has daily headaches. She also reports bilateral swelling in her optic nerve for which she has been referred to Neurology but has not been able to secure an appointment.  She denies RUQ.epigastric pain and denies new onset swelling or weight gain.         OB History    Gravida  1   Para      Term      Preterm      AB      Living        SAB      TAB      Ectopic      Multiple      Live Births                 Past Medical History:  Diagnosis Date  . Anxiety   . Chronic headache disorder 02/21/2018  . Depression   . Intracranial hypertension   . Migraine         Past Surgical History:  Procedure Laterality Date  . APPENDECTOMY    . LUMBAR PUNCTURE  2014   2017   . TONSILLECTOMY     Family History: family history includes Diabetes in her father, mother, and sister; Hypercholesterolemia in her father; Kidney disease in her father; Migraines in her father. Social History:  reports that she quit smoking about 16 months ago. She has never used smokeless tobacco. She reports previous alcohol use. She reports that she does not use drugs.     Maternal Diabetes: Yes:  Diabetes Type:  Diet controlled Genetic Screening: Normal Maternal Ultrasounds/Referrals: Normal Fetal Ultrasounds or other Referrals:  None Maternal Substance Abuse:  Yes:  Type: Marijuana Significant Maternal  Medications:  Meds include: Other:  Significant Maternal Lab Results:  None Other Comments:  None  Review of Systems History Blood pressure (!) 148/100, pulse 83, temperature 98.4 F (36.9 C), temperature source Oral, resp. rate 17, height 5\' 4"  (1.626 m), weight 127.5 kg, SpO2 100 %. Exam Physical Exam  Prenatal labs: ABO, Rh:   Antibody:   Rubella:   RPR:    HBsAg:    HIV:    GBS:     Assessment/Plan: 28yo prime at 68 0/7wks with hypertension with severe features and GDMA2 Admit Initiate medication for BP control: labetalol protocol and oral procardia 30 daily Start MgSO4 for seizure prevention BMZ now and again in 24hrs If able to control BP, likely iol for delivery at 37 weeks, if BP not well controlled,  will proceed to iol after second BMZ Monitor FSBS  - continue on metformin 500mg  in am/1000 in pm; cbg q am and after meals Rapid GBS ordered Covid sars pending  31 Theon Sobotka 10/27/2019, 9:27 PM       Revision History

## 2019-10-29 LAB — GLUCOSE, CAPILLARY: Glucose-Capillary: 112 mg/dL — ABNORMAL HIGH (ref 70–99)

## 2019-10-29 NOTE — Progress Notes (Addendum)
Patient ID: Kimberly Meadows, female   DOB: 12/28/1990, 29 y.o.   MRN: [id number]   G1 at 35+2, severe BP s/p Mg and BMZ, also GDM  +FM, no LOF, no VB, occ ctx; no PIH sx's.  No further elevated BP.  AFVSS 116-137/50-81 (135/77) gen NAD Abd gravid, NT  CBG  98-149, mildly elevated had BMZ  FHT 130-140, mod var, + accel, category 1 Ctx irr  Pt feeling well.  F/U at office as scheduled Thursday.

## 2019-10-29 NOTE — Discharge Summary (Signed)
OB Discharge Summary     Patient Name: Kimberly Meadows DOB: 06/08/1991 MRN: [id number]  Date of admission: 10/27/2019 Delivering MD: This patient has no babies on file.  Date of discharge: 10/29/2019  Admitting diagnosis: Preeclampsia, third trimester [O14.93] Intrauterine pregnancy: [redacted]w[redacted]d     Secondary diagnosis:  Active Problems:   Preeclampsia, third trimester  Additional problems: GDM (PIH)     Discharge diagnosis: GDM - On metformin, elevated BP - PIH                                                                                                Post partum procedures:N/A  Augmentation: N/A  Complications: None  Hospital course: Admitted, received Mg for elevated BP - well controlled w/o meds.  Received BMZ x 2; GDM - received metformin - sugars mildly elevated - had BMZ  Physical exam  Vitals:   10/28/19 1915 10/28/19 2203 10/29/19 0610 10/29/19 0747  BP: 107/66 129/71 137/76 135/77  Pulse: 72 71 77 85  Resp: 18 17 18 18   Temp: 98.4 F (36.9 C) 98.6 F (37 C) 97.9 F (36.6 C) 98.6 F (37 C)  TempSrc: Oral Oral Oral Oral  SpO2: 98% 99% 99% 99%  Weight:      Height:       General: alert and no distress Labs: Lab Results  Component Value Date   WBC 9.5 10/28/2019   HGB 11.7 (L) 10/28/2019   HCT 35.6 (L) 10/28/2019   MCV 89.9 10/28/2019   PLT 242 10/28/2019   CMP Latest Ref Rng & Units 10/28/2019  Glucose 70 - 99 mg/dL -  BUN 6 - 20 mg/dL -  Creatinine 0.44 - 1.00 mg/dL -  Sodium 135 - 145 mmol/L -  Potassium 3.5 - 5.1 mmol/L -  Chloride 98 - 111 mmol/L -  CO2 22 - 32 mmol/L -  Calcium 8.9 - 10.3 mg/dL -  Total Protein 6.5 - 8.1 g/dL 6.6  Total Bilirubin 0.3 - 1.2 mg/dL 0.3  Alkaline Phos 38 - 126 U/L 57  AST 15 - 41 U/L 17  ALT 0 - 44 U/L 16      After visit meds:  Allergies as of 10/29/2019      Reactions   No Known Allergies       Medication List    TAKE these medications   acetaminophen 325 MG tablet Commonly known as: TYLENOL Take 650  mg by mouth every 6 (six) hours as needed for mild pain or headache.   acetaZOLAMIDE 250 MG tablet Commonly known as: DIAMOX Take 1 tablet (250 mg total) by mouth 2 (two) times daily.   ferrous sulfate 325 (65 FE) MG tablet Take 325 mg by mouth every other day.   fluticasone 50 MCG/ACT nasal spray Commonly known as: FLONASE Place 2 sprays into both nostrils daily.   metFORMIN 500 MG tablet Commonly known as: GLUCOPHAGE Take 500-1,000 mg by mouth See admin instructions. Taking 1 tablet (500mg ) in the Am and 2 tabs (1000mg ) in the evening.   prenatal multivitamin Tabs tablet Take 2 tablets by mouth daily at 12  noon. Gummy       Diet: routine diet  Activity: Advance as tolerated.  Outpatient follow OE:CXFQ Thursday Follow up Appt:No future appointments. Follow up Visit:No follow-ups on file.    10/29/2019 Sherian Rein, MD

## 2019-10-31 LAB — CULTURE, BETA STREP (GROUP B ONLY)

## 2019-11-21 ENCOUNTER — Telehealth (HOSPITAL_COMMUNITY): Payer: Self-pay | Admitting: *Deleted

## 2019-11-21 ENCOUNTER — Encounter (HOSPITAL_COMMUNITY): Payer: Self-pay | Admitting: *Deleted

## 2019-11-21 NOTE — Telephone Encounter (Signed)
Preadmission screen  

## 2019-11-24 ENCOUNTER — Other Ambulatory Visit (HOSPITAL_COMMUNITY)
Admission: RE | Admit: 2019-11-24 | Discharge: 2019-11-24 | Disposition: A | Discharge status: Home / Self Care | Payer: Medicaid Other | Source: Ambulatory Visit | Attending: Obstetrics and Gynecology | Admitting: Obstetrics and Gynecology

## 2019-11-24 ENCOUNTER — Other Ambulatory Visit (HOSPITAL_COMMUNITY): Payer: Medicaid Other

## 2019-11-24 DIAGNOSIS — Z20822 Contact with and (suspected) exposure to covid-19: Secondary | ICD-10-CM | POA: Insufficient documentation

## 2019-11-24 DIAGNOSIS — Z01812 Encounter for preprocedural laboratory examination: Secondary | ICD-10-CM | POA: Diagnosis not present

## 2019-11-24 LAB — SARS CORONAVIRUS 2 (TAT 6-24 HRS): SARS Coronavirus 2: NEGATIVE

## 2019-11-26 ENCOUNTER — Encounter (HOSPITAL_COMMUNITY): Payer: Self-pay | Admitting: Obstetrics and Gynecology

## 2019-11-26 ENCOUNTER — Inpatient Hospital Stay (HOSPITAL_COMMUNITY): Payer: Medicaid Other | Admitting: Anesthesiology

## 2019-11-26 ENCOUNTER — Inpatient Hospital Stay (HOSPITAL_COMMUNITY): Payer: Medicaid Other

## 2019-11-26 ENCOUNTER — Inpatient Hospital Stay (HOSPITAL_COMMUNITY)
Admission: AD | Admit: 2019-11-26 | Discharge: 2019-11-29 | DRG: 807 | Disposition: A | Discharge status: Home / Self Care | Payer: Medicaid Other | Attending: Obstetrics and Gynecology | Admitting: Obstetrics and Gynecology

## 2019-11-26 ENCOUNTER — Other Ambulatory Visit: Payer: Self-pay

## 2019-11-26 DIAGNOSIS — O99344 Other mental disorders complicating childbirth: Secondary | ICD-10-CM | POA: Diagnosis present

## 2019-11-26 DIAGNOSIS — O99214 Obesity complicating childbirth: Secondary | ICD-10-CM | POA: Diagnosis present

## 2019-11-26 DIAGNOSIS — O99824 Streptococcus B carrier state complicating childbirth: Secondary | ICD-10-CM | POA: Diagnosis present

## 2019-11-26 DIAGNOSIS — O26893 Other specified pregnancy related conditions, third trimester: Secondary | ICD-10-CM | POA: Diagnosis not present

## 2019-11-26 DIAGNOSIS — F419 Anxiety disorder, unspecified: Secondary | ICD-10-CM | POA: Diagnosis present

## 2019-11-26 DIAGNOSIS — Z3490 Encounter for supervision of normal pregnancy, unspecified, unspecified trimester: Secondary | ICD-10-CM

## 2019-11-26 DIAGNOSIS — Z3A39 39 weeks gestation of pregnancy: Secondary | ICD-10-CM | POA: Diagnosis not present

## 2019-11-26 DIAGNOSIS — R030 Elevated blood-pressure reading, without diagnosis of hypertension: Secondary | ICD-10-CM | POA: Diagnosis not present

## 2019-11-26 DIAGNOSIS — Z87891 Personal history of nicotine dependence: Secondary | ICD-10-CM

## 2019-11-26 DIAGNOSIS — O24425 Gestational diabetes mellitus in childbirth, controlled by oral hypoglycemic drugs: Secondary | ICD-10-CM | POA: Diagnosis present

## 2019-11-26 DIAGNOSIS — O326XX0 Maternal care for compound presentation, not applicable or unspecified: Secondary | ICD-10-CM | POA: Diagnosis present

## 2019-11-26 LAB — RPR: RPR Ser Ql: NONREACTIVE

## 2019-11-26 LAB — HEPATIC FUNCTION PANEL
ALT: 15 U/L (ref 0–44)
AST: 16 U/L (ref 15–41)
Albumin: 2.6 g/dL — ABNORMAL LOW (ref 3.5–5.0)
Alkaline Phosphatase: 58 U/L (ref 38–126)
Bilirubin, Direct: <0.1 mg/dL (ref 0.0–0.2)
Total Bilirubin: 0.4 mg/dL (ref 0.3–1.2)
Total Protein: 5.8 g/dL — ABNORMAL LOW (ref 6.5–8.1)

## 2019-11-26 LAB — GLUCOSE, CAPILLARY
Glucose-Capillary: 110 mg/dL — ABNORMAL HIGH (ref 70–99)
Glucose-Capillary: 94 mg/dL (ref 70–99)
Glucose-Capillary: 79 mg/dL (ref 70–99)
Glucose-Capillary: 110 mg/dL — ABNORMAL HIGH (ref 70–99)

## 2019-11-26 LAB — CBC
HCT: 35.1 % — ABNORMAL LOW (ref 36.0–46.0)
HCT: 38 % (ref 36.0–46.0)
Hemoglobin: 11.8 g/dL — ABNORMAL LOW (ref 12.0–15.0)
Hemoglobin: 12.6 g/dL (ref 12.0–15.0)
MCH: 30.6 pg (ref 26.0–34.0)
MCH: 30 pg (ref 26.0–34.0)
MCHC: 33.6 g/dL (ref 30.0–36.0)
MCHC: 33.2 g/dL (ref 30.0–36.0)
MCV: 91.2 fL (ref 80.0–100.0)
MCV: 90.5 fL (ref 80.0–100.0)
Platelets: 220 10*3/uL (ref 150–400)
Platelets: 221 10*3/uL (ref 150–400)
RBC: 3.85 MIL/uL — ABNORMAL LOW (ref 3.87–5.11)
RBC: 4.2 MIL/uL (ref 3.87–5.11)
RDW: 15.4 % (ref 11.5–15.5)
RDW: 15.2 % (ref 11.5–15.5)
WBC: 9.8 10*3/uL (ref 4.0–10.5)
WBC: 9.1 10*3/uL (ref 4.0–10.5)
nRBC: 0 % (ref 0.0–0.2)
nRBC: 0 % (ref 0.0–0.2)

## 2019-11-26 LAB — PROTEIN / CREATININE RATIO, URINE
Creatinine, Urine: 95.52 mg/dL
Protein Creatinine Ratio: 0.14 mg/mg{Cre} (ref 0.00–0.15)
Total Protein, Urine: 13 mg/dL

## 2019-11-26 LAB — TYPE AND SCREEN
ABO/RH(D): AB POS
Antibody Screen: NEGATIVE

## 2019-11-26 MED ORDER — LIDOCAINE HCL (PF) 1 % IJ SOLN: 30.0000 mL | INTRAMUSCULAR | Status: DC | PRN

## 2019-11-26 MED ORDER — TERBUTALINE SULFATE 1 MG/ML IJ SOLN: 0.2500 mg | Freq: Once | INTRAMUSCULAR | Status: DC | PRN

## 2019-11-26 MED ORDER — SODIUM CHLORIDE 0.9 % IV SOLN
5.0000 10*6.[IU] | Freq: Once | INTRAVENOUS | Status: AC
  Administered 2019-11-26: 5 10*6.[IU] via INTRAVENOUS
  Filled 2019-11-26: qty 5

## 2019-11-26 MED ORDER — PENICILLIN G POT IN DEXTROSE 60000 UNIT/ML IV SOLN
3.0000 10*6.[IU] | INTRAVENOUS | Status: DC
  Administered 2019-11-26 – 2019-11-27 (×7): 3 10*6.[IU] via INTRAVENOUS
  Filled 2019-11-26 (×7): qty 50

## 2019-11-26 MED ORDER — HYDRALAZINE HCL 20 MG/ML IJ SOLN: 10.0000 mg | INTRAMUSCULAR | Status: DC | PRN

## 2019-11-26 MED ORDER — LABETALOL HCL 5 MG/ML IV SOLN: 40.0000 mg | INTRAVENOUS | Status: DC | PRN

## 2019-11-26 MED ORDER — LIDOCAINE HCL (PF) 1 % IJ SOLN
INTRAMUSCULAR | Status: DC | PRN
  Administered 2019-11-26: 5 mL via EPIDURAL
  Administered 2019-11-26: 3 mL via EPIDURAL
  Administered 2019-11-26: 2 mL via EPIDURAL

## 2019-11-26 MED ORDER — PHENYLEPHRINE 40 MCG/ML (10ML) SYRINGE FOR IV PUSH (FOR BLOOD PRESSURE SUPPORT): 80.0000 ug | PREFILLED_SYRINGE | INTRAVENOUS | Status: DC | PRN

## 2019-11-26 MED ORDER — LABETALOL HCL 5 MG/ML IV SOLN: 80.0000 mg | INTRAVENOUS | Status: DC | PRN

## 2019-11-26 MED ORDER — LABETALOL HCL 5 MG/ML IV SOLN: 20.0000 mg | INTRAVENOUS | Status: DC | PRN

## 2019-11-26 MED ORDER — FENTANYL-BUPIVACAINE-NACL 0.5-0.125-0.9 MG/250ML-% EP SOLN
EPIDURAL | Status: AC
  Filled 2019-11-26: qty 250

## 2019-11-26 MED ORDER — LABETALOL HCL 5 MG/ML IV SOLN
INTRAVENOUS | Status: AC
  Filled 2019-11-26: qty 4

## 2019-11-26 MED ORDER — SOD CITRATE-CITRIC ACID 500-334 MG/5ML PO SOLN: 30.0000 mL | ORAL | Status: DC | PRN

## 2019-11-26 MED ORDER — OXYCODONE-ACETAMINOPHEN 5-325 MG PO TABS: 1.0000 | ORAL_TABLET | ORAL | Status: DC | PRN

## 2019-11-26 MED ORDER — ONDANSETRON HCL 4 MG/2ML IJ SOLN
4.0000 mg | Freq: Four times a day (QID) | INTRAMUSCULAR | Status: DC | PRN
  Administered 2019-11-27: 4 mg via INTRAVENOUS
  Filled 2019-11-26: qty 2

## 2019-11-26 MED ORDER — BUTORPHANOL TARTRATE 1 MG/ML IJ SOLN
1.0000 mg | INTRAMUSCULAR | Status: DC | PRN
  Administered 2019-11-26 (×3): 1 mg via INTRAVENOUS
  Filled 2019-11-26 (×3): qty 1

## 2019-11-26 MED ORDER — MAGNESIUM SULFATE BOLUS VIA INFUSION
4.0000 g | Freq: Once | INTRAVENOUS | Status: AC
  Administered 2019-11-26: 4 g via INTRAVENOUS
  Filled 2019-11-26: qty 1000

## 2019-11-26 MED ORDER — LACTATED RINGERS IV SOLN: INTRAVENOUS | Status: DC

## 2019-11-26 MED ORDER — OXYTOCIN BOLUS FROM INFUSION
500.0000 mL | Freq: Once | INTRAVENOUS | Status: AC
  Administered 2019-11-27: 500 mL via INTRAVENOUS

## 2019-11-26 MED ORDER — OXYTOCIN 40 UNITS IN NORMAL SALINE INFUSION - SIMPLE MED
1.0000 m[IU]/min | INTRAVENOUS | Status: DC
  Administered 2019-11-26: 2 m[IU]/min via INTRAVENOUS

## 2019-11-26 MED ORDER — FENTANYL-BUPIVACAINE-NACL 0.5-0.125-0.9 MG/250ML-% EP SOLN: 12.0000 mL/h | EPIDURAL | Status: DC | PRN

## 2019-11-26 MED ORDER — LABETALOL HCL 5 MG/ML IV SOLN
20.0000 mg | INTRAVENOUS | Status: DC | PRN
  Administered 2019-11-26 (×2): 20 mg via INTRAVENOUS
  Filled 2019-11-26: qty 4

## 2019-11-26 MED ORDER — MAGNESIUM SULFATE 40 GM/1000ML IV SOLN
2.0000 g/h | INTRAVENOUS | Status: DC
  Administered 2019-11-26 – 2019-11-27 (×2): 2 g/h via INTRAVENOUS
  Filled 2019-11-26 (×2): qty 1000

## 2019-11-26 MED ORDER — FLEET ENEMA 7-19 GM/118ML RE ENEM: 1.0000 | ENEMA | RECTAL | Status: DC | PRN

## 2019-11-26 MED ORDER — DIPHENHYDRAMINE HCL 50 MG/ML IJ SOLN: 12.5000 mg | INTRAMUSCULAR | Status: DC | PRN

## 2019-11-26 MED ORDER — EPHEDRINE 5 MG/ML INJ: 10.0000 mg | INTRAVENOUS | Status: DC | PRN

## 2019-11-26 MED ORDER — OXYTOCIN 40 UNITS IN NORMAL SALINE INFUSION - SIMPLE MED
2.5000 [IU]/h | INTRAVENOUS | Status: DC
  Filled 2019-11-26: qty 1000

## 2019-11-26 MED ORDER — LACTATED RINGERS IV SOLN
500.0000 mL | Freq: Once | INTRAVENOUS | Status: AC
  Administered 2019-11-26: 500 mL via INTRAVENOUS

## 2019-11-26 MED ORDER — OXYCODONE-ACETAMINOPHEN 5-325 MG PO TABS: 2.0000 | ORAL_TABLET | ORAL | Status: DC | PRN

## 2019-11-26 MED ORDER — SODIUM CHLORIDE (PF) 0.9 % IJ SOLN
INTRAMUSCULAR | Status: DC | PRN
  Administered 2019-11-26: 12 mL/h via EPIDURAL

## 2019-11-26 MED ORDER — MISOPROSTOL 25 MCG QUARTER TABLET
25.0000 ug | ORAL_TABLET | ORAL | Status: DC
  Administered 2019-11-26 (×3): 25 ug via VAGINAL
  Filled 2019-11-26 (×3): qty 1

## 2019-11-26 MED ORDER — ACETAMINOPHEN 325 MG PO TABS
650.0000 mg | ORAL_TABLET | ORAL | Status: DC | PRN
  Administered 2019-11-27: 650 mg via ORAL
  Filled 2019-11-26: qty 2

## 2019-11-26 MED ORDER — LACTATED RINGERS IV SOLN: 500.0000 mL | INTRAVENOUS | Status: DC | PRN

## 2019-11-26 NOTE — Progress Notes (Addendum)
Patient ID: Kimberly Meadows, female   DOB: April 19, 1991, 29 y.o.   MRN: [id number] Pt comfortable with epidural. No complaints VSS - 126/67 EFM - 140, cat 1 TOCO - contractions q 2-26mins SVE 4.5/80/-2  A/P: Progressing in labor on pitocin now at         IUPC placed         Continue with augmentation         Continue on MgSO4

## 2019-11-26 NOTE — Progress Notes (Signed)
Patient ID: Kimberly Meadows, female   DOB: Jun 10, 1991, 29 y.o.   MRN: [id number] Pt reports increasing pain with contractions. Considering epidural. VS 142/80, 73 EFM - 130, cat 1 TOCO - contractions q 1-46mins on of pitocin SVE - 1.5/70/-2  Plan: S/P 3 doses cytotec, now on pitocin at          Cooks catheter placed with 60u/40v of saline           Recommend IV pain control now as prefers sitting in chair           Expectant mgmt: AROM when able then pitocin per protocol

## 2019-11-26 NOTE — Progress Notes (Signed)
Patient ID: Kimberly Meadows, female   DOB: Mar 09, 1991, 29 y.o.   MRN: [id number] Pt doing well. She is beginning to appreciate some contractions. She would like to walk the hallways. She denies HA or blurry vision.  VS - 150 - 157/77-91, 77 EFM - cat 1 140 TOCO - contractions q 2 mins   A/P: Continue with current plan of care         Check lft and u pr/cr         Ok to walk hallways

## 2019-11-26 NOTE — Progress Notes (Addendum)
Patient ID: Kimberly Meadows, female   DOB: 08-Apr-1991, 29 y.o.   MRN: [id number] Pt BP noted to increase to 160s/90s  IV labetalol protocol started with some resolution  Plan to start on MgSO4 now for seizure prophylaxis

## 2019-11-26 NOTE — H&P (Signed)
Kimberly Meadows is a 29 y.o.prime female presenting for medical induction of labor due to gestational diabetes at 10 2/7wks. She has been moderately controlled on metformin. Pt is dated per LMP which was confirmed with an 11wk [place]. Her pregnancy was otherwise complicated with pseudomotor cerebri, anxiety and obesity- all stable during pregnancy. Hx marijuana use. GBS positive. Neg essential panel and first trimester screen OB History    Gravida  1   Para      Term      Preterm      AB      Living        SAB      TAB      Ectopic      Multiple      Live Births             Past Medical History:  Diagnosis Date  . Anxiety   . Chronic headache disorder 02/21/2018  . Depression   . Diabetes mellitus without complication (HCC)    gestational diabetes  . Gestational diabetes   . Intracranial hypertension   . Migraine   . Optic nerve swelling   . Pseudotumor cerebri   . Pseudotumor cerebri    Past Surgical History:  Procedure Laterality Date  . APPENDECTOMY    . LUMBAR PUNCTURE  2014   2017   . TONSILLECTOMY     Family History: family history includes Diabetes in her father, mother, and sister; Hypercholesterolemia in her father; Kidney disease in her father; Migraines in her father. Social History:  reports that she quit smoking about 17 months ago. She has never used smokeless tobacco. She reports previous alcohol use. She reports that she does not use drugs.     Maternal Diabetes: Yes:  Diabetes Type:  Insulin/Medication controlled Genetic Screening: Normal Maternal Ultrasounds/Referrals: Normal Fetal Ultrasounds or other Referrals:  None Maternal Substance Abuse:  No Significant Maternal Medications:  Meds include: Other: metformin Significant Maternal Lab Results:  Group B Strep positive Other Comments:  None  Review of Systems  Constitutional: Negative for appetite change, chills and diaphoresis.  Eyes: Negative for photophobia and visual disturbance.   Respiratory: Negative for chest tightness and shortness of breath.   Cardiovascular: Negative for chest pain and leg swelling.  Gastrointestinal: Negative for abdominal pain.  Genitourinary: Negative for dyspareunia and pelvic pain.  Musculoskeletal: Negative for back pain.  Neurological: Negative for numbness.  Psychiatric/Behavioral: The patient is not nervous/anxious.    Maternal Medical History:  Reason for admission: Medical induction of labor due to gestational diabetes  Contractions: Frequency: irregular.   Perceived severity is mild.    Fetal activity: Perceived fetal activity is normal.    Prenatal Complications - Diabetes: gestational. Diabetes is managed by oral agent (monotherapy).      Dilation: 1 Effacement (%): 50 Station: -3 Exam by:: C Cornetto RN Blood pressure (!) 145/73, pulse 75, temperature 97.8 F (36.6 C), temperature source Oral, resp. rate 16, height 5\' 5"  (1.651 m), weight 130.2 kg. Maternal Exam:  Uterine Assessment: Contraction strength is mild.  Contraction frequency is irregular.   Abdomen: Patient reports generalized tenderness.  Estimated fetal weight is AGA.   Fetal presentation: vertex  Introitus: Normal vulva. Vulva is negative for condylomata and lesion.  Normal vagina.  Vagina is negative for condylomata.  Pelvis: adequate for delivery.      Fetal Exam Fetal Monitor Review: Baseline rate: 130.  Variability: moderate (6-25 bpm).   Pattern: accelerations present and no decelerations.  Fetal State Assessment: Category I - tracings are normal.     Physical Exam  Constitutional: She is oriented to person, place, and time. She appears well-developed and well-nourished.  Cardiovascular: Normal rate.  Respiratory: Effort normal.  GI: Soft. There is generalized abdominal tenderness.  Genitourinary:    Vulva, vagina and uterus normal.     No vulval condylomata or lesion noted.   Musculoskeletal:        General: Normal range of  motion.     Cervical back: Normal range of motion.  Neurological: She is alert and oriented to person, place, and time.  Skin: Skin is warm.  Psychiatric: She has a normal mood and affect. Her behavior is normal. Judgment and thought content normal.    Prenatal labs: ABO, Rh: --/--/AB POS (03/03 0055) Antibody: NEG (03/03 0055) Rubella: Immune (12/15 0000) RPR: Nonreactive (12/15 0000)  HBsAg: Negative (12/15 0000)  HIV: Non-reactive (12/15 0000)  GBS: Positive/-- (12/17 0000)   Assessment/Plan: 28yo prime at 67 2/[redacted]wks gestation for medical induction of labor due to gestational diabetes  - Admit - Cytotec overnight - Treat GBS with PCN - Pain control prn - Augment with pitocin prn/AROM - Monitor blood sugar - Anticipate svd Kimberly Meadows Kimberly Meadows 11/26/2019, 8:54 AM

## 2019-11-26 NOTE — Anesthesia Procedure Notes (Signed)
Epidural Patient location during procedure: OB Start time: 11/26/2019 7:00 PM End time: 11/26/2019 7:08 PM  Staffing Anesthesiologist: Cecile Hearing, MD Performed: anesthesiologist   Preanesthetic Checklist Completed: patient identified, IV checked, risks and benefits discussed, monitors and equipment checked, pre-op evaluation and timeout performed  Epidural Patient position: sitting Prep: DuraPrep Patient monitoring: blood pressure and continuous pulse ox Approach: midline Location: L3-L4 Injection technique: LOR air  Needle:  Needle type: Tuohy  Needle gauge: 17 G Needle length: 9 cm Needle insertion depth: 6 and 6.5 cm Catheter size: 19 Gauge Catheter at skin depth: 12 cm Test dose: negative and Other (1% Lidocaine)  Additional Notes Patient identified.  Risk benefits discussed including failed block, incomplete pain control, headache, nerve damage, paralysis, blood pressure changes, nausea, vomiting, reactions to medication both toxic or allergic, and postpartum back pain.  Patient expressed understanding and wished to proceed.  All questions were answered.  Sterile technique used throughout procedure and epidural site dressed with sterile barrier dressing. No paresthesia or other complications noted. The patient did not experience any signs of intravascular injection such as tinnitus or metallic taste in mouth nor signs of intrathecal spread such as rapid motor block. Please see nursing notes for vital signs. Reason for block:procedure for pain

## 2019-11-26 NOTE — Progress Notes (Signed)
Patient, including her family have no history of problems with general anesthesia, including MH. Airway is a MP 3.  Patient reports she has a problem with intracranial hypertension, with a history of 3 spinal taps.  She is planning an epidural and her questions and concerns were addressed. She knows to notify RN when epidural is desired.

## 2019-11-26 NOTE — Anesthesia Preprocedure Evaluation (Signed)
Anesthesia Evaluation  Patient identified by MRN, date of birth, ID band Patient awake    Reviewed: Allergy & Precautions, NPO status , Patient's Chart, lab work & pertinent test results  Airway Mallampati: II  TM Distance: >3 FB Neck ROM: Full    Dental  (+) Teeth Intact, Dental Advisory Given   Pulmonary neg pulmonary ROS, former smoker,    Pulmonary exam normal breath sounds clear to auscultation       Cardiovascular hypertension, Normal cardiovascular exam Rhythm:Regular Rate:Normal     Neuro/Psych  Headaches, PSYCHIATRIC DISORDERS Anxiety Depression Intracranial hypertension-last therapeutic LP 2019    GI/Hepatic negative GI ROS, Neg liver ROS,   Endo/Other  diabetes, Gestational, Oral Hypoglycemic AgentsMorbid obesity  Renal/GU negative Renal ROS     Musculoskeletal negative musculoskeletal ROS (+)   Abdominal   Peds  Hematology negative hematology ROS (+) Plt 221k   Anesthesia Other Findings Day of surgery medications reviewed with the patient.  Reproductive/Obstetrics (+) Pregnancy                             Anesthesia Physical Anesthesia Plan  ASA: III  Anesthesia Plan: Epidural   Post-op Pain Management:    Induction:   PONV Risk Score and Plan: 2  Airway Management Planned: Natural Airway  Additional Equipment:   Intra-op Plan:   Post-operative Plan:   Informed Consent: I have reviewed the patients History and Physical, chart, labs and discussed the procedure including the risks, benefits and alternatives for the proposed anesthesia with the patient or authorized representative who has indicated his/her understanding and acceptance.     Dental advisory given  Plan Discussed with:   Anesthesia Plan Comments: (Patient identified. Risks/Benefits/Options discussed with patient including but not limited to bleeding, infection, nerve damage, paralysis, failed  block, incomplete pain control, headache, blood pressure changes, nausea, vomiting, reactions to medication both or allergic, itching and postpartum back pain. Confirmed with bedside nurse the patient's most recent platelet count. Confirmed with patient that they are not currently taking any anticoagulation, have any bleeding history or any family history of bleeding disorders. Patient expressed understanding and wished to proceed. All questions were answered. )        Anesthesia Quick Evaluation

## 2019-11-26 NOTE — Progress Notes (Signed)
Patient ID: Kimberly Meadows, female   DOB: 08/07/1991, 29 y.o.   MRN: [id number] Pt reported increasingly painful contractions and pelvic pain.  Pt opted for epidural  Shortly afterwards, foley bulb came out  Cervix  4/70/-2  AROM performed with moderate return of clear fluid  Plan to titrate pitocin per protocol Expectant mgmt for svd

## 2019-11-27 ENCOUNTER — Encounter (HOSPITAL_COMMUNITY): Payer: Self-pay | Admitting: Obstetrics and Gynecology

## 2019-11-27 LAB — CBC
HCT: 36.9 % (ref 36.0–46.0)
Hemoglobin: 12 g/dL (ref 12.0–15.0)
MCH: 29.8 pg (ref 26.0–34.0)
MCHC: 32.5 g/dL (ref 30.0–36.0)
MCV: 91.6 fL (ref 80.0–100.0)
Platelets: 218 10*3/uL (ref 150–400)
RBC: 4.03 MIL/uL (ref 3.87–5.11)
RDW: 15.3 % (ref 11.5–15.5)
WBC: 16.7 10*3/uL — ABNORMAL HIGH (ref 4.0–10.5)
nRBC: 0 % (ref 0.0–0.2)

## 2019-11-27 LAB — GLUCOSE, CAPILLARY: Glucose-Capillary: 116 mg/dL — ABNORMAL HIGH (ref 70–99)

## 2019-11-27 MED ORDER — DIBUCAINE (PERIANAL) 1 % EX OINT: 1.0000 "application " | TOPICAL_OINTMENT | CUTANEOUS | Status: DC | PRN

## 2019-11-27 MED ORDER — DIPHENOXYLATE-ATROPINE 2.5-0.025 MG PO TABS
2.0000 | ORAL_TABLET | Freq: Once | ORAL | Status: AC
  Administered 2019-11-27: 2 via ORAL
  Filled 2019-11-27: qty 2
  Filled 2019-11-27: qty 1

## 2019-11-27 MED ORDER — IBUPROFEN 600 MG PO TABS
600.0000 mg | ORAL_TABLET | Freq: Four times a day (QID) | ORAL | Status: DC
  Administered 2019-11-27 – 2019-11-29 (×8): 600 mg via ORAL
  Filled 2019-11-27 (×8): qty 1

## 2019-11-27 MED ORDER — ACETAMINOPHEN 325 MG PO TABS
650.0000 mg | ORAL_TABLET | ORAL | Status: DC | PRN
  Administered 2019-11-27 – 2019-11-28 (×2): 650 mg via ORAL
  Filled 2019-11-27 (×2): qty 2

## 2019-11-27 MED ORDER — COCONUT OIL OIL
1.0000 "application " | TOPICAL_OIL | Status: DC | PRN
  Administered 2019-11-29: 1 via TOPICAL

## 2019-11-27 MED ORDER — WITCH HAZEL-GLYCERIN EX PADS: 1.0000 "application " | MEDICATED_PAD | CUTANEOUS | Status: DC | PRN

## 2019-11-27 MED ORDER — SENNOSIDES-DOCUSATE SODIUM 8.6-50 MG PO TABS
2.0000 | ORAL_TABLET | ORAL | Status: DC
  Administered 2019-11-27 – 2019-11-29 (×2): 2 via ORAL
  Filled 2019-11-27 (×2): qty 2

## 2019-11-27 MED ORDER — TETANUS-DIPHTH-ACELL PERTUSSIS 5-2.5-18.5 LF-MCG/0.5 IM SUSP: 0.5000 mL | Freq: Once | INTRAMUSCULAR | Status: DC

## 2019-11-27 MED ORDER — TRANEXAMIC ACID-NACL 1000-0.7 MG/100ML-% IV SOLN
INTRAVENOUS | Status: AC
  Administered 2019-11-27: 1000 mg
  Filled 2019-11-27: qty 100

## 2019-11-27 MED ORDER — SIMETHICONE 80 MG PO CHEW: 80.0000 mg | CHEWABLE_TABLET | ORAL | Status: DC | PRN

## 2019-11-27 MED ORDER — TRANEXAMIC ACID-NACL 1000-0.7 MG/100ML-% IV SOLN: 1000.0000 mg | Freq: Once | INTRAVENOUS | Status: DC

## 2019-11-27 MED ORDER — ONDANSETRON HCL 4 MG PO TABS: 4.0000 mg | ORAL_TABLET | ORAL | Status: DC | PRN

## 2019-11-27 MED ORDER — DOCUSATE SODIUM 100 MG PO CAPS
100.0000 mg | ORAL_CAPSULE | Freq: Two times a day (BID) | ORAL | Status: DC
  Administered 2019-11-27 – 2019-11-29 (×4): 100 mg via ORAL
  Filled 2019-11-27 (×4): qty 1

## 2019-11-27 MED ORDER — CARBOPROST TROMETHAMINE 250 MCG/ML IM SOLN
INTRAMUSCULAR | Status: AC
  Administered 2019-11-27: 250 ug
  Filled 2019-11-27: qty 1

## 2019-11-27 MED ORDER — DIPHENHYDRAMINE HCL 25 MG PO CAPS: 25.0000 mg | ORAL_CAPSULE | Freq: Four times a day (QID) | ORAL | Status: DC | PRN

## 2019-11-27 MED ORDER — LACTATED RINGERS IV SOLN: INTRAVENOUS | Status: DC

## 2019-11-27 MED ORDER — PRENATAL MULTIVITAMIN CH
1.0000 | ORAL_TABLET | Freq: Every day | ORAL | Status: DC
  Administered 2019-11-27 – 2019-11-29 (×3): 1 via ORAL
  Filled 2019-11-27 (×3): qty 1

## 2019-11-27 MED ORDER — ONDANSETRON HCL 4 MG/2ML IJ SOLN: 4.0000 mg | INTRAMUSCULAR | Status: DC | PRN

## 2019-11-27 MED ORDER — ZOLPIDEM TARTRATE 5 MG PO TABS: 5.0000 mg | ORAL_TABLET | Freq: Every evening | ORAL | Status: DC | PRN

## 2019-11-27 MED ORDER — BENZOCAINE-MENTHOL 20-0.5 % EX AERO
1.0000 "application " | INHALATION_SPRAY | CUTANEOUS | Status: DC | PRN
  Administered 2019-11-29: 1 via TOPICAL
  Filled 2019-11-27: qty 56

## 2019-11-27 MED ORDER — CARBOPROST TROMETHAMINE 250 MCG/ML IM SOLN: 250.0000 ug | Freq: Once | INTRAMUSCULAR | Status: DC

## 2019-11-27 NOTE — Progress Notes (Signed)
Patient ID: Kimberly Meadows, female   DOB: 17-Mar-1991, 29 y.o.   MRN: [id number] Pt still comfortable and denies HA or blurry vision or malaise VS - 122-129/73-79  GEN - NAD EFM - 140, cat 1 TOCO - contractions q 1-2 mins  Pit at ; MVUs 140-200 SVE - unchanged at 4.5cm  A/P: Continue to increase pitocin per protocol till adequate mvus 220          Expectant mgmt

## 2019-11-27 NOTE — Lactation Note (Signed)
This note was copied from a baby's chart. Lactation Consultation Note  Patient Name: Kimberly Meadows [id number] Date: 11/27/2019 Reason for consult: Initial assessment;1st time breastfeeding;Primapara;Term Newborn is 4 hours old and has been to breast once.  Baby is currently crying in crib.  Offered feeding assist and mom agreeable.  Mom states she has been taught hand expression.  Baby positioned in football hold on right side.  Nipples are erect but short shafted.  Baby latched easily and fed for 15 minutes.  Infant stimulation and breast massage needed.  Discussed colostrum and milk coming to volume.  Instructed to feed with any feeding cue and call for assist prn.  Questions answered.  Breastfeeding consultation services information left with patient.  Maternal Data Has patient been taught Hand Expression?: Yes Does the patient have breastfeeding experience prior to this delivery?: No  Feeding Feeding Type: Breast Fed  LATCH Score Latch: Grasps breast easily, tongue down, lips flanged, rhythmical sucking.  Audible Swallowing: A few with stimulation  Type of Nipple: Everted at rest and after stimulation  Comfort (Breast/Nipple): Soft / non-tender  Hold (Positioning): Assistance needed to correctly position infant at breast and maintain latch.  LATCH Score: 8  Interventions Interventions: Breast compression;Breast feeding basics reviewed;Assisted with latch;Adjust position;Skin to skin;Support pillows;Breast massage  Lactation Tools Discussed/Used     Consult Status Consult Status: Follow-up Date: 11/28/19    Huston Foley 11/27/2019, 1:33 PM

## 2019-11-27 NOTE — Anesthesia Postprocedure Evaluation (Signed)
Anesthesia Post Note  Patient: Kimberly Meadows  Procedure(s) Performed: AN AD HOC LABOR EPIDURAL     Patient location during evaluation: Mother Baby Anesthesia Type: Epidural Level of consciousness: awake and alert Pain management: pain level controlled Vital Signs Assessment: post-procedure vital signs reviewed and stable Respiratory status: spontaneous breathing, nonlabored ventilation and respiratory function stable Cardiovascular status: stable Postop Assessment: no headache, no backache, epidural receding, no apparent nausea or vomiting, patient able to bend at knees, adequate PO intake and able to ambulate Anesthetic complications: no    Last Vitals:  Vitals:   11/27/19 1510 11/27/19 1608  BP:  112/64  Pulse:  98  Resp: 18 18  Temp:  36.9 C  SpO2:  100%    Last Pain:  Vitals:   11/27/19 1615  TempSrc:   PainSc: 0-No pain   Pain Goal:                   [employer]

## 2019-11-27 NOTE — Lactation Note (Signed)
This note was copied from a baby's chart. Lactation Consultation Note  Patient Name: Girl Catlin Doria GHWEX'H Date: 11/27/2019 Reason for consult: Follow-up assessment;Mother's request;Primapara;1st time breastfeeding;Maternal endocrine disorder Type of Endocrine Disorder?: Diabetes  1845 - I followed up with Ms. Bedoy upon request. Pecola Leisure was on her left breast in football hold upon entry. She appeared to be holding the breast in her mouth without suckling and then releasing the breast. I assisted with latching baby to this side showing Ms. Buescher how to compress the tissue into a "sandwich" to help baby maintain grasp. Baby latched with breast tissue movement and suckling sequences. Baby needs manual support at the breast initially.  I also showed her support person how to help Ms. Stetler perform this task, and he was able to repeat back. Baby fed about 7 minutes and fell asleep at the breast. Baby's arms were limp with stimulation.  I educated on day 1 infant feeding patterns and encouraged Ms. Mancusi to monitor for feeding cues and breast feed on demand 8-12 times a day, waking to feed as needed. Ms. Matzek has a DEBP set up in the room. I encouraged her to pump a few times this evening for stimulation and to feed any EBM back to baby for additional supplementation. I provided spoons.  I reviewed day 1 infant feeding patterns and other breast feeding basics.  All questions answered at this time.   Maternal Data Does the patient have breastfeeding experience prior to this delivery?: No  Feeding Feeding Type: Breast Fed  LATCH Score Latch: Repeated attempts needed to sustain latch, nipple held in mouth throughout feeding, stimulation needed to elicit sucking reflex.  Audible Swallowing: A few with stimulation  Type of Nipple: Everted at rest and after stimulation  Comfort (Breast/Nipple): Soft / non-tender  Hold (Positioning): Assistance needed to correctly position infant at  breast and maintain latch.  LATCH Score: 7  Interventions Interventions: Breast feeding basics reviewed;Assisted with latch;Skin to skin;Hand express;Breast compression;Support pillows  Lactation Tools Discussed/Used Tools: Other (comment);Pump(spoon) Breast pump type: Double-Electric Breast Pump Pump Review: Setup, frequency, and cleaning Date initiated:: 11/27/19   Consult Status Consult Status: Follow-up Date: 11/28/19 Follow-up type: In-patient    Walker Shadow 11/27/2019, 8:15 PM

## 2019-11-27 NOTE — Progress Notes (Signed)
Labor Note  S: Feels more pelvic pressure, occ nausea. Mild HA resolved with Tylenol otherwise denies PreE symptoms  O: BP 138/84   Pulse 97   Temp 98.3 F (36.8 C) (Oral)   Resp 16   Ht 5\' 5"  (1.651 m)   Wt 130.2 kg   SpO2 97%   BMI 47.76 kg/m  CE: complete/0-+1 FHR: Baseline 135, 10x10 accels, -decels, min variability TOCO 2-3, pitocin at 10mU/min, MVUs 180  A/P: This is a 29 y.o. G1P0 at [redacted]w[redacted]d  admitted for IOL for GDMA2 on Metformin. Now w/ PreE w/ SF (BP) on MgSO4. GBS pos on PCN FWB: Cat 1-2, c/w MgSo4 admin MWB: comfortable s/p epiduiral, now w/ more pelvic pressure Labor course: Pt now complete. Practice pushes at this time  Anticipate SVD

## 2019-11-27 NOTE — Progress Notes (Signed)
Anesthesiologist notified of platelets and order received to d/s epidural catheter.

## 2019-11-27 NOTE — Progress Notes (Deleted)
Patient ID: Kimberly Meadows, female   DOB: 10-22-1990, 29 y.o.   MRN: [id number] Pt doing well with no complaints.  VSS 122-129/73-79 CAT 1 strip Contractions q SVE - foley still in place; cervix still 1cm Pitocin at FSBS 79-110  Continue with labor induction

## 2019-11-28 LAB — CBC
HCT: 27 % — ABNORMAL LOW (ref 36.0–46.0)
Hemoglobin: 9.2 g/dL — ABNORMAL LOW (ref 12.0–15.0)
MCH: 31 pg (ref 26.0–34.0)
MCHC: 34.1 g/dL (ref 30.0–36.0)
MCV: 90.9 fL (ref 80.0–100.0)
Platelets: 222 10*3/uL (ref 150–400)
RBC: 2.97 MIL/uL — ABNORMAL LOW (ref 3.87–5.11)
RDW: 16 % — ABNORMAL HIGH (ref 11.5–15.5)
WBC: 11.2 10*3/uL — ABNORMAL HIGH (ref 4.0–10.5)
nRBC: 0 % (ref 0.0–0.2)

## 2019-11-28 NOTE — Progress Notes (Signed)
Pt requests formula to supplement while on magnesium. Pt educated on breastfeeding cues, benefits of breastfeeding, skin to skin, DEBP (pump every 3-4 hours while awake or after feeding), formula supplementation risks, and preparation and disposal of formula. Education given information on supplementation amounts. All questions answered.

## 2019-11-28 NOTE — Progress Notes (Signed)
Post Partum Day 1 Subjective: no complaints, up ad lib and tolerating PO  No HA or PIH sx Pt would like d/c home this PM if able to go.   Objective: Blood pressure (!) 101/58, pulse 67, temperature 97.8 F (36.6 C), temperature source Oral, resp. rate 18, height 5\' 5"  (1.651 m), weight 130.2 kg, SpO2 99 %, unknown if currently breastfeeding.  Physical Exam:  General: alert and cooperative Lochia: appropriate Uterine Fundus: firm   Recent Labs    11/26/19 1607 11/27/19 1104  HGB 12.6 12.0  HCT 38.0 36.9    Assessment/Plan: D/c magnesium this AM and follow BP BP thus far postpartum are WNL but advised patient will have to assess if rebound off the magnesium Will followup this PM to see if stable for d/c   LOS: 2 days   01/27/20 11/28/2019, 9:16 AM

## 2019-11-28 NOTE — Discharge Summary (Signed)
OB Discharge Summary     Patient Name: Kimberly Meadows DOB: 08/02/1991 MRN: [id number]  Date of admission: 11/26/2019 Delivering MD: Eula Flax M   Date of discharge: 11/29/2019  Admitting diagnosis: Pregnancy [Z34.90] Intrauterine pregnancy: [redacted]w[redacted]d     Secondary diagnosis:  Active Problems:   Pregnancy  Additional problems: Gestational Diabetes controlled on metformin                                       GBS positive                                       Elevated blood pressures in labor to severe range     Discharge diagnosis: Term Pregnancy Delivered                                                                                                Post partum procedures:magnesium  Augmentation: AROM, Pitocin, Cytotec and Foley Balloon  Complications: None  Hospital course:  Induction of Labor With Vaginal Delivery   29 y.o. yo G1P1001 at [redacted]w[redacted]d was admitted to the hospital 11/26/2019 for induction of labor.  Indication for induction: A2 DM.  Patient had an uncomplicated labor course as follows: Membrane Rupture Time/Date: 7:41 PM ,11/26/2019   Intrapartum Procedures: Episiotomy: None [1]                                         Lacerations:  1st degree [2];Labial [10];Periurethral [8]  Patient had delivery of a Viable infant.  Information for the patient's newborn:  Deissy, Guilbert [[id number]]  Delivery Method: Vag-Spont    11/27/2019  Details of delivery can be found in separate delivery note.  Patient received magnesium for 24 hours postpartum and then after discontinued it was normal at discharge on no medications. Patient is discharged home 11/29/19.  Physical exam  Vitals:   11/28/19 1314 11/28/19 2045 11/29/19 0026 11/29/19 0431  BP: 109/61 112/90 131/71 (!) 114/59  Pulse: 87 86 96 77  Resp: 18 17 18 18   Temp: 98.2 F (36.8 C) 98.4 F (36.9 C) 98.4 F (36.9 C) 97.9 F (36.6 C)  TempSrc: Oral Oral Oral Oral  SpO2: 100% 100%    Weight:      Height:        General: alert and cooperative Lochia: appropriate Uterine Fundus: firm  Labs: Lab Results  Component Value Date   WBC 11.2 (H) 11/28/2019   HGB 9.2 (L) 11/28/2019   HCT 27.0 (L) 11/28/2019   MCV 90.9 11/28/2019   PLT 222 11/28/2019   CMP Latest Ref Rng & Units 11/26/2019  Glucose 70 - 99 mg/dL -  BUN 6 - 20 mg/dL -  Creatinine 0.44 - 1.00 mg/dL -  Sodium 135 - 145 mmol/L -  Potassium 3.5 - 5.1 mmol/L -  Chloride 98 - 111 mmol/L -  CO2 22 - 32 mmol/L -  Calcium 8.9 - 10.3 mg/dL -  Total Protein 6.5 - 8.1 g/dL 2.7(C)  Total Bilirubin 0.3 - 1.2 mg/dL 0.4  Alkaline Phos 38 - 126 U/L 58  AST 15 - 41 U/L 16  ALT 0 - 44 U/L 15    Discharge instruction: per After Visit Summary and "Baby and Me Booklet".  After visit meds:  Allergies as of 11/29/2019   No Known Allergies     Medication List    STOP taking these medications   metFORMIN 500 MG tablet Commonly known as: GLUCOPHAGE     TAKE these medications   acetaminophen 325 MG tablet Commonly known as: TYLENOL Take 650 mg by mouth every 6 (six) hours as needed for mild pain or headache.   ferrous sulfate 325 (65 FE) MG tablet Take 325 mg by mouth every other day.   ibuprofen 600 MG tablet Commonly known as: ADVIL Take 1 tablet (600 mg total) by mouth every 6 (six) hours.   prenatal multivitamin Tabs tablet Take 2 tablets by mouth daily at 12 noon. Gummy       Diet: routine diet  Activity: Advance as tolerated. Pelvic rest for 6 weeks.   Outpatient follow up:4 days Follow up Appt:No future appointments. Follow up Visit:No follow-ups on file.  Postpartum contraception: Condoms  Newborn Data: Live born female  Birth Weight: 6 lb 12.6 oz (3079 g) APGAR: 9, 9  Newborn Delivery   Birth date/time: 11/27/2019 09:19:00 Delivery type: Vaginal, Spontaneous      Baby Feeding: Bottle and Breast Disposition:home with mother   11/29/2019 Oliver Pila, MD

## 2019-11-28 NOTE — Progress Notes (Signed)
Patient ID: Kimberly Meadows, female   DOB: 07-Oct-1990, 29 y.o.   MRN: [id number] Pt came off magnesium this AM and feeling well BP have been WNL.  Pt would like d/c this PM if baby able to go, awaiting bilirubin check  Reviewed postpartum instructions and d/w pt will need BP f/u in office in next 4-5 days Will d/c if baby able to go

## 2019-11-28 NOTE — Progress Notes (Signed)
MOB was referred for history of depression/anxiety. * Referral screened out by Clinical Social Worker because none of the following criteria appear to apply: ~ History of anxiety/depression during this pregnancy, or of post-partum depression following prior delivery. Per prenatal records review no concerns of anxiety noted and no diagnosis of depression noted.  ~ Diagnosis of anxiety and/or depression within last 3 years. Per prenatal records review, "anxiety - since childhood" and per chart review, depression dates back to 2017.  OR * MOB's symptoms currently being treated with medication and/or therapy. Please contact the Clinical Social Worker if needs arise, by [facility] request, or if MOB scores greater than 9/yes to question 10 on Edinburgh Postpartum Depression Screen.  CSW received consult for hx of marijuana use.  Referral was screened out due to the following: ~MOB had no documented substance use after initial prenatal visit/+UPT. ~MOB had no positive drug screens after initial prenatal visit/+UPT. ~Baby's UDS is negative.  Please consult CSW if current concerns arise or by MOB's request.  CSW will monitor CDS results and make report to Child Protective Services if warranted.  Celso Sickle, LCSW Clinical Social Worker [facility] Cell#: [phone]

## 2019-11-28 NOTE — Lactation Note (Addendum)
This note was copied from a baby's chart. Lactation Consultation Note  Patient Name: Girl Teana Lindahl [id number] Date: 11/28/2019   Infant is now 46 hrs old. Mom reported a formula feeding via bottle around 0200 with 2 attempts since then. No real intake noted over the last 6 hrs. Mom was placed in side-lying position. Infant latched with ease. However, only non-nutritive sucking was noted. Mom was interested in supplementing at breast. This was done with a 5Fr/syringe. Infant did very well, as there was no need to push the plunger of the syringe during the feeding.  I showed parents how to wash the parts (dishwashing detergent in room) & explained about insertion of depth into corner of infant's mouth. I also explained to Irving Burton, Charity fundraiser.   I asked Mom to get some sleep & then to express her milk whenever infant receives formula. Mom reports minimal breast changes w/pregnancy.   Mom's Magnesium was just turned off. Mom had a PPH (800 ml) at birth.   Mom's anatomy:   Mom has a 1 cm "cyst" on the anterior portion of her R breast. It does not cause her discomfort & she has had it since July. On the lateral underside of Mom's R breast, there is a site where she had a similar "cyst" when she was 49 or 34 yo. Mom said that the former cyst had had pus in it. Per Mom, the current cyst has not had any "pus."  1005: Size 21 flanges provided for pumping. 1048: Extra 5Fr tubing & 12-mL syringe provided.  Lurline Hare [facility] 11/28/2019, 8:38 AM

## 2019-11-29 MED ORDER — IBUPROFEN 600 MG PO TABS: 600.0000 mg | ORAL_TABLET | Freq: Four times a day (QID) | ORAL | 0 refills | Status: AC

## 2019-11-29 NOTE — Progress Notes (Signed)
Teaching complete  Questions answered  

## 2019-11-29 NOTE — Progress Notes (Signed)
Post Partum Day 1 Subjective: no complaints, up ad lib and tolerating PO  Objective: Blood pressure (!) 114/59, pulse 77, temperature 97.9 F (36.6 C), temperature source Oral, resp. rate 18, height 5\' 5"  (1.651 m), weight 130.2 kg, SpO2 100 %, unknown if currently breastfeeding.  Physical Exam:  General: alert and cooperative Lochia: appropriate Uterine Fundus: firm   Recent Labs    11/27/19 1104 11/28/19 1143  HGB 12.0 9.2*  HCT 36.9 27.0*    Assessment/Plan: Discharge home  Baby may have to room in pending bilirubin level.  D/w pt BP stable and WNL but will recheck in office next week   LOS: 3 days   01/28/20 11/29/2019, 9:41 AM

## 2019-11-29 NOTE — Lactation Note (Signed)
This note was copied from a baby's chart. Lactation Consultation Note:  P1, infant is 35 hours old. Mother has been bottle feeding formula.  She reports that she is only getting small amts of colostrum when pumping.  Mother reports that she has slight nipple tenderness. She was given comfort gels. She was advised to page [facility] when she pumps again to check flange size.   Mother reports that she is undecided which [facility] plan she is going to pick . She reports that she is active with WIC in [facility].  She was given a harmony hand pump with instructions. She plans to use a #27 flange. Advised mother to pump every 2-3 hours for 15 mins on each  Breast.  Discussed supply and demand and encouraged mother to begin attempting to place infant STS and offer breast before bottles if she desires to breastfeed.   Discussed treatment and prevention of engorgement.  Mother is aware of available LC services.   Patient Name: Kimberly Meadows [id number] Date: 11/29/2019 Reason for consult: Follow-up assessment   Maternal Data    Feeding Feeding Type: Bottle Fed - Formula Nipple Type: Slow - flow  LATCH Score                   Interventions Interventions: Comfort gels;Hand pump  Lactation Tools Discussed/Used WIC Program: Yes Pump Review: Setup, frequency, and cleaning;Milk Storage Initiated by:: Stevan Born RN, IBCLC Date initiated:: 11/29/19   Consult Status Consult Status: Follow-up Date: 11/29/19 Follow-up type: In-patient    Stevan Born [facility] 11/29/2019, 11:24 AM

## 2019-12-01 LAB — SURGICAL PATHOLOGY

## 2019-12-12 ENCOUNTER — Other Ambulatory Visit: Payer: Self-pay

## 2019-12-12 ENCOUNTER — Inpatient Hospital Stay (HOSPITAL_COMMUNITY)
Admission: AD | Admit: 2019-12-12 | Discharge: 2019-12-12 | Disposition: A | Discharge status: Home / Self Care | Payer: Medicaid Other | Attending: Obstetrics and Gynecology | Admitting: Obstetrics and Gynecology

## 2019-12-12 ENCOUNTER — Encounter (HOSPITAL_COMMUNITY): Payer: Self-pay | Admitting: Obstetrics and Gynecology

## 2019-12-12 DIAGNOSIS — Z87891 Personal history of nicotine dependence: Secondary | ICD-10-CM | POA: Insufficient documentation

## 2019-12-12 DIAGNOSIS — A5901 Trichomonal vulvovaginitis: Secondary | ICD-10-CM | POA: Diagnosis not present

## 2019-12-12 DIAGNOSIS — O9833 Other infections with a predominantly sexual mode of transmission complicating the puerperium: Secondary | ICD-10-CM | POA: Insufficient documentation

## 2019-12-12 DIAGNOSIS — O135 Gestational [pregnancy-induced] hypertension without significant proteinuria, complicating the puerperium: Secondary | ICD-10-CM | POA: Diagnosis not present

## 2019-12-12 LAB — CBC
HCT: 31.2 % — ABNORMAL LOW (ref 36.0–46.0)
Hemoglobin: 10.1 g/dL — ABNORMAL LOW (ref 12.0–15.0)
MCH: 29.9 pg (ref 26.0–34.0)
MCHC: 32.4 g/dL (ref 30.0–36.0)
MCV: 92.3 fL (ref 80.0–100.0)
Platelets: 323 10*3/uL (ref 150–400)
RBC: 3.38 MIL/uL — ABNORMAL LOW (ref 3.87–5.11)
RDW: 15.3 % (ref 11.5–15.5)
WBC: 9 10*3/uL (ref 4.0–10.5)
nRBC: 0 % (ref 0.0–0.2)

## 2019-12-12 LAB — COMPREHENSIVE METABOLIC PANEL
ALT: 12 U/L (ref 0–44)
AST: 16 U/L (ref 15–41)
Albumin: 3.2 g/dL — ABNORMAL LOW (ref 3.5–5.0)
Alkaline Phosphatase: 51 U/L (ref 38–126)
Anion gap: 9 (ref 5–15)
BUN: 12 mg/dL (ref 6–20)
CO2: 25 mmol/L (ref 22–32)
Calcium: 9.1 mg/dL (ref 8.9–10.3)
Chloride: 105 mmol/L (ref 98–111)
Creatinine, Ser: 0.84 mg/dL (ref 0.44–1.00)
GFR calc Af Amer: >60 mL/min (ref >60)
GFR calc non Af Amer: >60 mL/min (ref >60)
Glucose, Bld: 103 mg/dL — ABNORMAL HIGH (ref 70–99)
Potassium: 3.7 mmol/L (ref 3.5–5.1)
Sodium: 139 mmol/L (ref 135–145)
Total Bilirubin: 0.3 mg/dL (ref 0.3–1.2)
Total Protein: 6 g/dL — ABNORMAL LOW (ref 6.5–8.1)

## 2019-12-12 LAB — URINALYSIS, ROUTINE W REFLEX MICROSCOPIC
Bilirubin Urine: NEGATIVE
Glucose, UA: NEGATIVE mg/dL
Ketones, ur: NEGATIVE mg/dL
Nitrite: NEGATIVE
Protein, ur: NEGATIVE mg/dL
Specific Gravity, Urine: 1.021 (ref 1.005–1.030)
WBC, UA: >50 WBC/hpf — ABNORMAL HIGH (ref 0–5)
pH: 6 (ref 5.0–8.0)

## 2019-12-12 LAB — PROTEIN / CREATININE RATIO, URINE
Creatinine, Urine: 187.33 mg/dL
Protein Creatinine Ratio: 0.07 mg/mg{Cre} (ref 0.00–0.15)
Total Protein, Urine: 14 mg/dL

## 2019-12-12 MED ORDER — NIFEDIPINE 10 MG PO CAPS: 10.0000 mg | ORAL_CAPSULE | ORAL | Status: DC | PRN

## 2019-12-12 NOTE — MAU Note (Signed)
.   Kimberly Meadows is a 29 y.o. is here in MAU reporting:she was sent over from the office after her follow up in the office for B/P check. Pt states that she started taking labetalol 100mg  BID on WED. Was on Mag before she delivered on March 4th. Denies pain   Pain score: 0 Vitals:   12/12/19 1315  BP: (!) 150/94  Pulse: 72  Resp: 16  Temp: 97.8 F (36.6 C)  SpO2: 100%     FHT: Lab orders placed from triage: UA

## 2019-12-12 NOTE — MAU Provider Note (Signed)
History     CSN: [id number]  Arrival date and time: 12/12/19 1255   First Provider Initiated Contact with Patient 12/12/19 1341      Chief Complaint  Patient presents with  . postpartum hypertension   HPI Kimberly Meadows 29 y.o. Postpartum and was in the office today.  Was on Procardia after delivery at [redacted]w[redacted]d with BP checks in the office.  Procardia did not manage her hypertension and was changed to labetalol on Wednesday and seen in the office today for a BP check and BP was elevated.  Comes to MAU today.   Hx of hypertension with severe features and had MgSO4 at 35 weeks.Marland Kitchen  Hx of gestational diabetes and took metformin in the pregnancy.  Client has a headache but often has headaches due to Elevated cerebral pressure.  No blurred vision.  No edema.  Does not have BP cuff at home - gets BP checks in the office.   OB History    Gravida  1   Para  1   Term  1   Preterm      AB      Living  1     SAB      TAB      Ectopic      Multiple  0   Live Births  1           Past Medical History:  Diagnosis Date  . Anxiety   . Chronic headache disorder 02/21/2018  . Depression   . Diabetes mellitus without complication (HCC)    gestational diabetes  . Gestational diabetes   . Intracranial hypertension   . Migraine   . Optic nerve swelling   . Pseudotumor cerebri   . Pseudotumor cerebri     Past Surgical History:  Procedure Laterality Date  . APPENDECTOMY    . LUMBAR PUNCTURE  2014   2017   . TONSILLECTOMY      Family History  Problem Relation Age of Onset  . Diabetes Mother   . Migraines Father   . Diabetes Father   . Hypercholesterolemia Father   . Kidney disease Father   . Diabetes Sister     Social History   Tobacco Use  . Smoking status: Former Smoker    Quit date: 06/02/2018    Years since quitting: 1.5  . Smokeless tobacco: Never Used  Substance Use Topics  . Alcohol use: Not Currently    Comment: rare  . Drug use: Yes    Types:  Marijuana    Allergies: No Known Allergies  Medications Prior to Admission  Medication Sig Dispense Refill Last Dose  . acetaminophen (TYLENOL) 325 MG tablet Take 650 mg by mouth every 6 (six) hours as needed for mild pain or headache.   Past Week at Unknown time  . ferrous sulfate 325 (65 FE) MG tablet Take 325 mg by mouth every other day.   Past Month at Unknown time  . ibuprofen (ADVIL) 600 MG tablet Take 1 tablet (600 mg total) by mouth every 6 (six) hours. 30 tablet 0 12/12/2019 at 1030  . labetalol (NORMODYNE) 100 MG tablet Take 100 mg by mouth 2 (two) times daily.   12/12/2019 at 1030  . Prenatal Vit-Fe Fumarate-FA (PRENATAL MULTIVITAMIN) TABS tablet Take 2 tablets by mouth daily at 12 noon. Gummy   12/11/2019 at Unknown time    Review of Systems  Constitutional: Negative for fever.  Respiratory: Negative for cough and shortness of breath.   Cardiovascular:  Negative for leg swelling.       Elevated BP  Gastrointestinal: Negative for abdominal pain, nausea and vomiting.  Genitourinary: Negative for dysuria.   Physical Exam   Blood pressure (!) 148/79, pulse 80, temperature 97.8 F (36.6 C), resp. rate 16, weight 122 kg, SpO2 100 %, unknown if currently breastfeeding.  Physical Exam  Nursing note and vitals reviewed. Constitutional: She is oriented to person, place, and time. She appears well-developed and well-nourished.  HENT:  Head: Normocephalic.  Eyes: EOM are normal.  GI: Soft.  No RUQ pain  Musculoskeletal:        General: No edema. Normal range of motion.     Cervical back: Neck supple.  Neurological: She is alert and oriented to person, place, and time.  Skin: Skin is warm and dry.  Psychiatric: She has a normal mood and affect.   Vitals:   12/12/19 1415 12/12/19 1430 12/12/19 1445 12/12/19 1500  BP: (!) 149/80 (!) 147/81 (!) 167/87 (!) 150/81  Pulse: 68 68 64 65  Resp:      Temp:      SpO2:      Weight:        MAU Course  Procedures Labs Results for  orders placed or performed during the hospital encounter of 12/12/19 (from the past 24 hour(s))  CBC     Status: Abnormal   Collection Time: 12/12/19  1:59 PM  Result Value Ref Range   WBC 9.0 4.0 - 10.5 K/uL   RBC 3.38 (L) 3.87 - 5.11 MIL/uL   Hemoglobin 10.1 (L) 12.0 - 15.0 g/dL   HCT 06.3 (L) 01.6 - 01.0 %   MCV 92.3 80.0 - 100.0 fL   MCH 29.9 26.0 - 34.0 pg   MCHC 32.4 30.0 - 36.0 g/dL   RDW 93.2 35.5 - 73.2 %   Platelets 323 150 - 400 K/uL   nRBC 0.0 0.0 - 0.2 %  Comprehensive metabolic panel     Status: Abnormal   Collection Time: 12/12/19  1:59 PM  Result Value Ref Range   Sodium 139 135 - 145 mmol/L   Potassium 3.7 3.5 - 5.1 mmol/L   Chloride 105 98 - 111 mmol/L   CO2 25 22 - 32 mmol/L   Glucose, Bld 103 (H) 70 - 99 mg/dL   BUN 12 6 - 20 mg/dL   Creatinine, Ser 2.02 0.44 - 1.00 mg/dL   Calcium 9.1 8.9 - 54.2 mg/dL   Total Protein 6.0 (L) 6.5 - 8.1 g/dL   Albumin 3.2 (L) 3.5 - 5.0 g/dL   AST 16 15 - 41 U/L   ALT 12 0 - 44 U/L   Alkaline Phosphatase 51 38 - 126 U/L   Total Bilirubin 0.3 0.3 - 1.2 mg/dL   GFR calc non Af Amer >60 >60 mL/min   GFR calc Af Amer >60 >60 mL/min   Anion gap 9 5 - 15  Urinalysis, Routine w reflex microscopic     Status: Abnormal   Collection Time: 12/12/19  2:23 PM  Result Value Ref Range   Color, Urine YELLOW YELLOW   APPearance HAZY (A) CLEAR   Specific Gravity, Urine 1.021 1.005 - 1.030   pH 6.0 5.0 - 8.0   Glucose, UA NEGATIVE NEGATIVE mg/dL   Hgb urine dipstick SMALL (A) NEGATIVE   Bilirubin Urine NEGATIVE NEGATIVE   Ketones, ur NEGATIVE NEGATIVE mg/dL   Protein, ur NEGATIVE NEGATIVE mg/dL   Nitrite NEGATIVE NEGATIVE   Leukocytes,Ua LARGE (A) NEGATIVE  RBC / HPF 11-20 0 - 5 RBC/hpf   WBC, UA >50 (H) 0 - 5 WBC/hpf   Bacteria, UA RARE (A) NONE SEEN   Squamous Epithelial / LPF 0-5 0 - 5   Mucus PRESENT    Trichomonas, UA PRESENT (A) NONE SEEN  Protein / creatinine ratio, urine     Status: None   Collection Time: 12/12/19  2:23  PM  Result Value Ref Range   Creatinine, Urine 187.33 mg/dL   Total Protein, Urine 14 mg/dL   Protein Creatinine Ratio 0.07 0.00 - 0.15 mg/mg[Cre]     MDM Reviewed BPs and plan of care with Dr. Harolyn Rutherford.    Assessment and Plan  Gestational hypertension, postpartum Trichomonas  Plan Will call client and sent trichomonas to her pharmacy Increase dose of labatelol - 200 mg PO BID  Have BP checked in the office on Monday. Client left before urinalysis completed.  Trichomonas noted on urinalysis result - Client called by phone with 2 identifiers.  Discussed trichomonas in urine.  Client was very surprised and questioning whether today's results were accurate as this had never been mentioned in her pregnancy and she has not had any additional partners.  Advised to have a vaginal swab done in the office on Monday.   Raymond Azure L Malanie Koloski 12/12/2019, 1:44 PM

## 2019-12-12 NOTE — Discharge Instructions (Signed)
Change your labetalol to 200 mg by mouth twice a day. Be seen in the office on Monday to have your blood pressure rechecked.

## 2021-06-24 ENCOUNTER — Other Ambulatory Visit: Payer: Self-pay | Admitting: Obstetrics and Gynecology

## 2021-06-24 DIAGNOSIS — N6310 Unspecified lump in the right breast, unspecified quadrant: Secondary | ICD-10-CM

## 2021-07-13 ENCOUNTER — Ambulatory Visit
Admission: RE | Admit: 2021-07-13 | Discharge: 2021-07-13 | Disposition: A | Discharge status: Home / Self Care | Payer: Medicaid Other | Source: Ambulatory Visit | Attending: Obstetrics and Gynecology | Admitting: Obstetrics and Gynecology

## 2021-07-13 ENCOUNTER — Other Ambulatory Visit: Payer: Self-pay

## 2021-07-13 ENCOUNTER — Ambulatory Visit
Admission: RE | Admit: 2021-07-13 | Discharge: 2021-07-13 | Disposition: A | Discharge status: Home / Self Care | Payer: No Typology Code available for payment source | Source: Ambulatory Visit | Attending: Obstetrics and Gynecology | Admitting: Obstetrics and Gynecology

## 2021-07-13 DIAGNOSIS — N6310 Unspecified lump in the right breast, unspecified quadrant: Secondary | ICD-10-CM

## 2022-01-06 ENCOUNTER — Other Ambulatory Visit: Payer: Self-pay

## 2022-01-06 ENCOUNTER — Encounter (HOSPITAL_BASED_OUTPATIENT_CLINIC_OR_DEPARTMENT_OTHER): Payer: Self-pay | Admitting: Emergency Medicine

## 2022-01-06 ENCOUNTER — Emergency Department (HOSPITAL_BASED_OUTPATIENT_CLINIC_OR_DEPARTMENT_OTHER): Payer: No Typology Code available for payment source

## 2022-01-06 ENCOUNTER — Emergency Department (HOSPITAL_BASED_OUTPATIENT_CLINIC_OR_DEPARTMENT_OTHER)
Admission: EM | Admit: 2022-01-06 | Discharge: 2022-01-06 | Disposition: A | Discharge status: Home / Self Care | Payer: No Typology Code available for payment source | Attending: Emergency Medicine | Admitting: Emergency Medicine

## 2022-01-06 DIAGNOSIS — E119 Type 2 diabetes mellitus without complications: Secondary | ICD-10-CM | POA: Insufficient documentation

## 2022-01-06 DIAGNOSIS — N898 Other specified noninflammatory disorders of vagina: Secondary | ICD-10-CM | POA: Diagnosis not present

## 2022-01-06 DIAGNOSIS — R102 Pelvic and perineal pain: Secondary | ICD-10-CM | POA: Insufficient documentation

## 2022-01-06 DIAGNOSIS — R1030 Lower abdominal pain, unspecified: Secondary | ICD-10-CM

## 2022-01-06 DIAGNOSIS — B9689 Other specified bacterial agents as the cause of diseases classified elsewhere: Secondary | ICD-10-CM

## 2022-01-06 LAB — COMPREHENSIVE METABOLIC PANEL
ALT: 8 U/L (ref 0–44)
AST: 10 U/L — ABNORMAL LOW (ref 15–41)
Albumin: 4.1 g/dL (ref 3.5–5.0)
Alkaline Phosphatase: 44 U/L (ref 38–126)
Anion gap: 9 (ref 5–15)
BUN: 14 mg/dL (ref 6–20)
CO2: 25 mmol/L (ref 22–32)
Calcium: 9.4 mg/dL (ref 8.9–10.3)
Chloride: 104 mmol/L (ref 98–111)
Creatinine, Ser: 0.67 mg/dL (ref 0.44–1.00)
GFR, Estimated: >60 mL/min (ref >60)
Glucose, Bld: 86 mg/dL (ref 70–99)
Potassium: 3.6 mmol/L (ref 3.5–5.1)
Sodium: 138 mmol/L (ref 135–145)
Total Bilirubin: 0.3 mg/dL (ref 0.3–1.2)
Total Protein: 6.9 g/dL (ref 6.5–8.1)

## 2022-01-06 LAB — CBC WITH DIFFERENTIAL/PLATELET
Abs Immature Granulocytes: 0.05 10*3/uL (ref 0.00–0.07)
Basophils Absolute: 0 10*3/uL (ref 0.0–0.1)
Basophils Relative: 0 %
Eosinophils Absolute: 0.2 10*3/uL (ref 0.0–0.5)
Eosinophils Relative: 1 %
HCT: 40.4 % (ref 36.0–46.0)
Hemoglobin: 12.8 g/dL (ref 12.0–15.0)
Immature Granulocytes: 0 %
Lymphocytes Relative: 26 %
Lymphs Abs: 3.2 10*3/uL (ref 0.7–4.0)
MCH: 26.9 pg (ref 26.0–34.0)
MCHC: 31.7 g/dL (ref 30.0–36.0)
MCV: 84.9 fL (ref 80.0–100.0)
Monocytes Absolute: 0.8 10*3/uL (ref 0.1–1.0)
Monocytes Relative: 6 %
Neutro Abs: 8.2 10*3/uL — ABNORMAL HIGH (ref 1.7–7.7)
Neutrophils Relative %: 67 %
Platelets: 291 10*3/uL (ref 150–400)
RBC: 4.76 MIL/uL (ref 3.87–5.11)
RDW: 16.1 % — ABNORMAL HIGH (ref 11.5–15.5)
WBC: 12.4 10*3/uL — ABNORMAL HIGH (ref 4.0–10.5)
nRBC: 0 % (ref 0.0–0.2)

## 2022-01-06 LAB — URINALYSIS, ROUTINE W REFLEX MICROSCOPIC
Bilirubin Urine: NEGATIVE
Glucose, UA: NEGATIVE mg/dL
Ketones, ur: NEGATIVE mg/dL
Nitrite: NEGATIVE
Protein, ur: 30 mg/dL — AB
Specific Gravity, Urine: 1.035 — ABNORMAL HIGH (ref 1.005–1.030)
pH: 6 (ref 5.0–8.0)

## 2022-01-06 LAB — WET PREP, GENITAL
Sperm: NONE SEEN
Trich, Wet Prep: NONE SEEN
WBC, Wet Prep HPF POC: >=10 — AB (ref <10)
Yeast Wet Prep HPF POC: NONE SEEN

## 2022-01-06 LAB — LIPASE, BLOOD: Lipase: 31 U/L (ref 11–51)

## 2022-01-06 LAB — PREGNANCY, URINE: Preg Test, Ur: NEGATIVE

## 2022-01-06 MED ORDER — METRONIDAZOLE 500 MG PO TABS: 500.0000 mg | ORAL_TABLET | Freq: Two times a day (BID) | ORAL | 0 refills | Status: AC

## 2022-01-06 MED ORDER — METRONIDAZOLE 500 MG PO TABS
500.0000 mg | ORAL_TABLET | Freq: Once | ORAL | Status: AC
  Administered 2022-01-06: 500 mg via ORAL
  Filled 2022-01-06: qty 1

## 2022-01-06 NOTE — Discharge Instructions (Signed)
You came to the emergency department today to be evaluated for your abdominal pain.  Your lab work showed that you have a bacterial vaginosis infection.  Due to this you were started on the medication Flagyl.  It is very important that you do not consume any alcohol while taking Flagyl (metronidazole) as it will cause you to become violently ill.  ? ?Today you have been tested for gonorrhea and chlamydia.  The test to determine if you have these will take a few days. You should receive a call if these results are positive.  On your discharge information there is also instructions on how to sign up for my chart.  Please complete this so that you can follow the results on your own also.  If positive please got to [facility], your primary care doctor, or urgent care to start treatment. ? ?Please do not have any sexual activity for the next two weeks.  After that please make sure that you always use a condom every time you have sex.  If you have any new or concerning symptoms please seek additional medical care and evaluation.    ? ?Get help right away if: ?Your pain does not go away as soon as your health care provider told you to expect. ?You cannot stop vomiting. ?Your pain is only in areas of the abdomen, such as the right side or the left lower portion of the abdomen. Pain on the right side could be caused by appendicitis. ?You have bloody or black stools, or stools that look like tar. ?You have severe pain, cramping, or bloating in your abdomen. ?You have signs of dehydration, such as: ?Dark urine, very little urine, or no urine. ?Cracked lips. ?Dry mouth. ?Sunken eyes. ?Sleepiness. ?Weakness. ?You have trouble breathing or chest pain. ?

## 2022-01-06 NOTE — ED Provider Notes (Signed)
?[facility] EMERGENCY DEPT ?Provider Note ? ? ?CSN: [id number] ?Arrival date & time: 01/06/22  1631 ? ?  ? ?History ? ?Chief Complaint  ?Patient presents with  ? Abdominal Pain  ?  pelv  ? Pelvic Pain  ? ? ?Kimberly Meadows is a 31 y.o. female with a medical history of migraine, depression, anxiety, diabetes mellitus, pseudotumor cerebri.  Presents emergency department with a complaint of lower abdominal/pelvic pain.  Patient states that her pain started yesterday.  Pain has been constant.  Patient describes pain as a cramping which becomes sharp when sitting, standing, or changing positions.  Patient states that her pain is improved when laying in a supine position.  Patient states that pain onset was gradual and has become progressively worse over time. ? ?Patient also planes of vaginal discharge.  States that vaginal discharge is been present over the last few days.  Patient describes vaginal discharge as clear and reports that this is a normal discharge for her. ? ?Patient denies any fever, chills, nausea, vomiting, constipation, diarrhea, blood in stool, melena, dysuria, hematuria, urinary urgency, urinary frequency, vaginal pain, vaginal bleeding, lightheadedness, syncope. ? ? ?Patient endorses is previous appendectomy.  LMP 4/7.  Patient is sexually active with multiple female partners.  Patient reports that she has an IUD in place.  Patient states that she always uses condoms with penetrative vaginal intercourse. ? ? ?Abdominal Pain ?Associated symptoms: vaginal discharge   ?Associated symptoms: no chest pain, no chills, no constipation, no diarrhea, no dysuria, no fever, no hematuria, no nausea, no shortness of breath, no vaginal bleeding and no vomiting   ?Pelvic Pain ?Associated symptoms include abdominal pain. Pertinent negatives include no chest pain, no headaches and no shortness of breath.  ? ?  ? ?Home Medications ?Prior to Admission medications   ?Medication Sig Start Date End Date Taking?  Authorizing Provider  ?acetaminophen (TYLENOL) 325 MG tablet Take 650 mg by mouth every 6 (six) hours as needed for mild pain or headache.    [provider]  ?ibuprofen (ADVIL) 600 MG tablet Take 1 tablet (600 mg total) by mouth every 6 (six) hours. 11/29/19   Huel Coteichardson, Kathy, MD  ?labetalol (NORMODYNE) 100 MG tablet Take 100 mg by mouth 2 (two) times daily.    [provider]  ?Prenatal Vit-Fe Fumarate-FA (PRENATAL MULTIVITAMIN) TABS tablet Take 2 tablets by mouth daily at 12 noon. Gummy    [provider]  ?   ? ?Allergies    ?Patient has no known allergies.   ? ?Review of Systems   ?Review of Systems  ?Constitutional:  Negative for chills and fever.  ?Eyes:  Negative for visual disturbance.  ?Respiratory:  Negative for shortness of breath.   ?Cardiovascular:  Negative for chest pain.  ?Gastrointestinal:  Positive for abdominal pain. Negative for abdominal distention, anal bleeding, blood in stool, constipation, diarrhea, nausea, rectal pain and vomiting.  ?Genitourinary:  Positive for pelvic pain and vaginal discharge. Negative for difficulty urinating, dysuria, flank pain, frequency, genital sores, hematuria, vaginal bleeding and vaginal pain.  ?Musculoskeletal:  Negative for back pain and neck pain.  ?Skin:  Negative for color change and rash.  ?Neurological:  Negative for dizziness, syncope, light-headedness and headaches.  ?Psychiatric/Behavioral:  Negative for confusion.   ? ?Physical Exam ?Updated Vital Signs ?BP (!) 183/106   Pulse 70   Temp 98.4 ?F (36.9 ?C) (Oral)   Resp 17   Ht 5\' 5"  (1.651 m)   Wt 112.9 kg   SpO2 99%  BMI 41.44 kg/m?  ?Physical Exam ?Vitals and nursing note reviewed. Exam conducted with a chaperone present (Female RN present as chaperone).  ?Constitutional:   ?   General: She is not in acute distress. ?   Appearance: She is not ill-appearing, toxic-appearing or diaphoretic.  ?HENT:  ?   Head: Normocephalic.  ?Eyes:  ?   General: No scleral icterus.     ?   Right eye: No discharge.     ?   Left eye: No discharge.  ?Cardiovascular:  ?   Rate and Rhythm: Normal rate.  ?Pulmonary:  ?   Effort: Pulmonary effort is normal.  ?Abdominal:  ?   General: Abdomen is protuberant. Bowel sounds are normal. There is no distension. There are no signs of injury.  ?   Palpations: Abdomen is soft. There is no mass or pulsatile mass.  ?   Tenderness: There is abdominal tenderness in the right lower quadrant, suprapubic area and left lower quadrant. There is no right CVA tenderness, left CVA tenderness, guarding or rebound.  ?   Hernia: There is no hernia in the umbilical area, ventral area, left inguinal area or right inguinal area.  ?   Comments: Minimal tenderness to lower abdomen.  ?Genitourinary: ?   Pubic Area: No rash or pubic lice.   ?   Tanner stage (genital): 5.  ?   Labia:     ?   Right: No rash, tenderness, lesion or injury.     ?   Left: No rash, tenderness, lesion or injury.   ?   Vagina: No signs of injury and foreign body. Vaginal discharge present. No erythema, tenderness, bleeding, lesions or prolapsed vaginal walls.  ?   Cervix: No cervical motion tenderness, discharge, friability, lesion, erythema, cervical bleeding or eversion.  ?   Uterus: Not enlarged and not tender.   ?   Adnexa: Right adnexa normal and left adnexa normal.  ?   Comments: IUD strings noted coming from cervical os.  Moderate amount of thin white discharge within vaginal vault. ?Lymphadenopathy:  ?   Lower Body: No right inguinal adenopathy. No left inguinal adenopathy.  ?Skin: ?   General: Skin is warm and dry.  ?Neurological:  ?   General: No focal deficit present.  ?   Mental Status: She is alert.  ?Psychiatric:     ?   Behavior: Behavior is cooperative.  ? ? ?ED Results / Procedures / Treatments   ?Labs ?(all labs ordered are listed, but only abnormal results are displayed) ?Labs Reviewed  ?WET PREP, GENITAL - Abnormal; Notable for the following components:  ?    Result Value  ? Clue Cells Wet  Prep HPF POC PRESENT (*)   ? WBC, Wet Prep HPF POC >=10 (*)   ? All other components within normal limits  ?URINALYSIS, ROUTINE W REFLEX MICROSCOPIC - Abnormal; Notable for the following components:  ? Specific Gravity, Urine 1.035 (*)   ? Hgb urine dipstick SMALL (*)   ? Protein, ur 30 (*)   ? Leukocytes,Ua LARGE (*)   ? All other components within normal limits  ?COMPREHENSIVE METABOLIC PANEL - Abnormal; Notable for the following components:  ? AST 10 (*)   ? All other components within normal limits  ?CBC WITH DIFFERENTIAL/PLATELET - Abnormal; Notable for the following components:  ? WBC 12.4 (*)   ? RDW 16.1 (*)   ? Neutro Abs 8.2 (*)   ? All other components within normal limits  ?PREGNANCY,  URINE  ?LIPASE, BLOOD  ?GC/CHLAMYDIA PROBE AMP ([place]) NOT AT [facility]  ? ? ?EKG ?None ? ?Radiology ?No results found. ? ?Procedures ?Procedures  ? ? ?Medications Ordered in ED ?Medications - No data to display ? ?ED Course/ Medical Decision Making/ A&P ?  ?                        ?Medical Decision Making ?Amount and/or Complexity of Data Reviewed ?Labs: ordered. ?Radiology: ordered. ? ?Risk ?Prescription drug management. ? ? ?Alert 31 year old female no acute distress, nontoxic-appearing.  Presents the emergency department with a complaint of abdominal pain, pelvic pain, and vaginal discharge. ? ?Information is obtained from patient.  Past medical records were reviewed including previous provider notes, labs, and imaging.  Patient has medical history as outlined in HPI which complicates her care ? ?Due to reports of abdominal pain CMP, CBC, lipase, urinalysis were obtained.  With reports of vaginal discharge will obtain urine pregnancy test as well as testing for gonorrhea and chlamydia and wet prep.  Patient was offered testing for HIV and syphilis however declines at this time.  Patient was offered empiric treatment for gonorrhea and chlamydia however however declines at this time. ? ?On physical exam patient has no  cervical motion tenderness or tenderness to bilateral adnexa.  Low suspicion for PID at this time. ? ?With tenderness to abdomen on exam we will plan to obtain CT abdomen pelvis to evaluate for possible dive

## 2022-01-06 NOTE — ED Triage Notes (Signed)
Pt endorses lower abd and pelvic pain since yesterday. Spoke to [facility] and has [place] and pelvic scheduled for Monday. LMP last week. Also having vaginal discharge.  ?

## 2022-01-09 LAB — GC/CHLAMYDIA PROBE AMP (~~LOC~~) NOT AT ARMC
Chlamydia: NEGATIVE
Comment: NEGATIVE
Comment: NORMAL
Neisseria Gonorrhea: NEGATIVE

## 2023-01-26 IMAGING — US US BREAST*R* LIMITED INC AXILLA
1 series · 7 of 7 positions shown · non-contrast
Comparison: None.

ACR Breast Density Category a: The breast tissue is almost entirely
fatty.

CLINICAL DATA: 30-year-old female with a palpable area of concern
in the slightly upper right breast that has been present for
approximately 2 years.

EXAM:
DIGITAL DIAGNOSTIC BILATERAL MAMMOGRAM WITH TOMOSYNTHESIS AND CAD;
ULTRASOUND RIGHT BREAST LIMITED
TECHNIQUE: Bilateral digital diagnostic mammography and breast tomosynthesis
was performed. The images were evaluated with computer-aided
detection.; Targeted ultrasound examination of the right breast was
performed

[Series 1: us breast*right* limited inc axilla · 0.04mm/px · 7 of 7 slices shown]
[im 1/7]
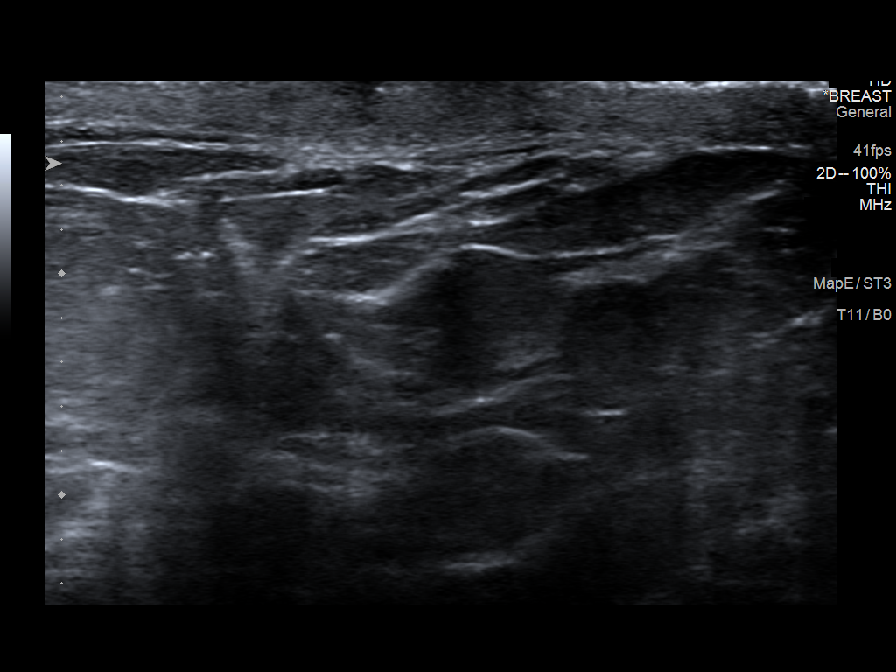
[im 2/7]
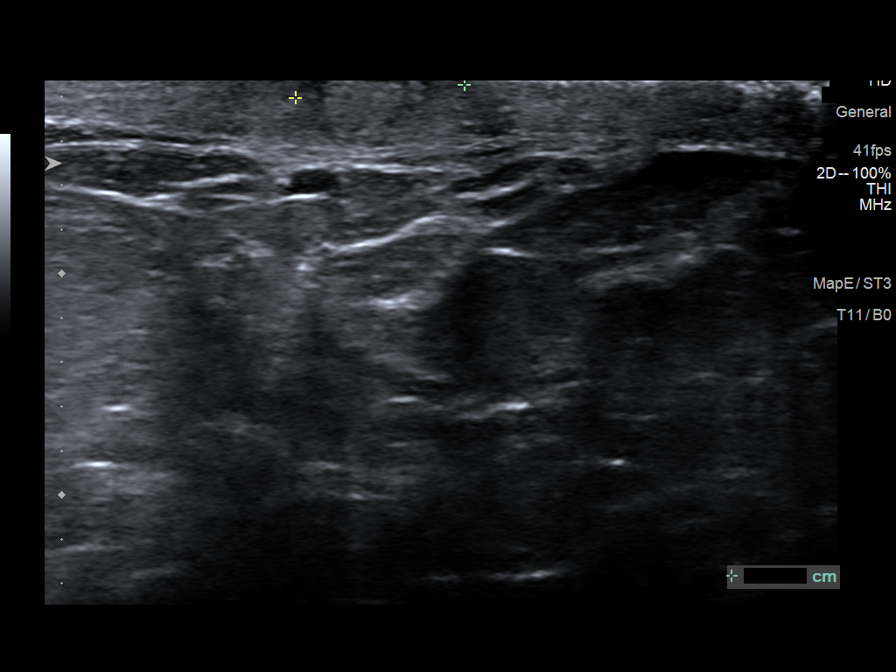
[im 3/7]
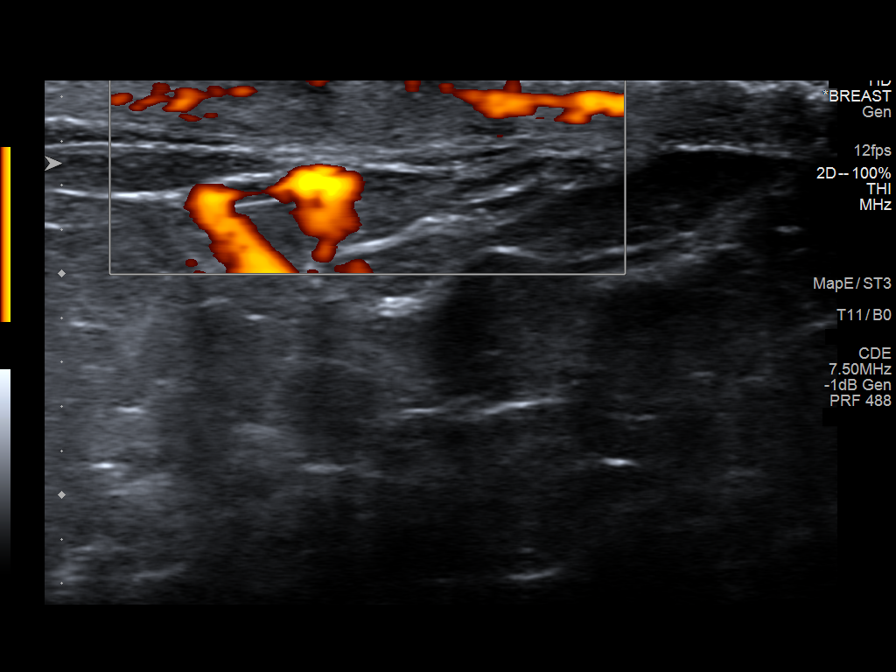
[im 4/7]
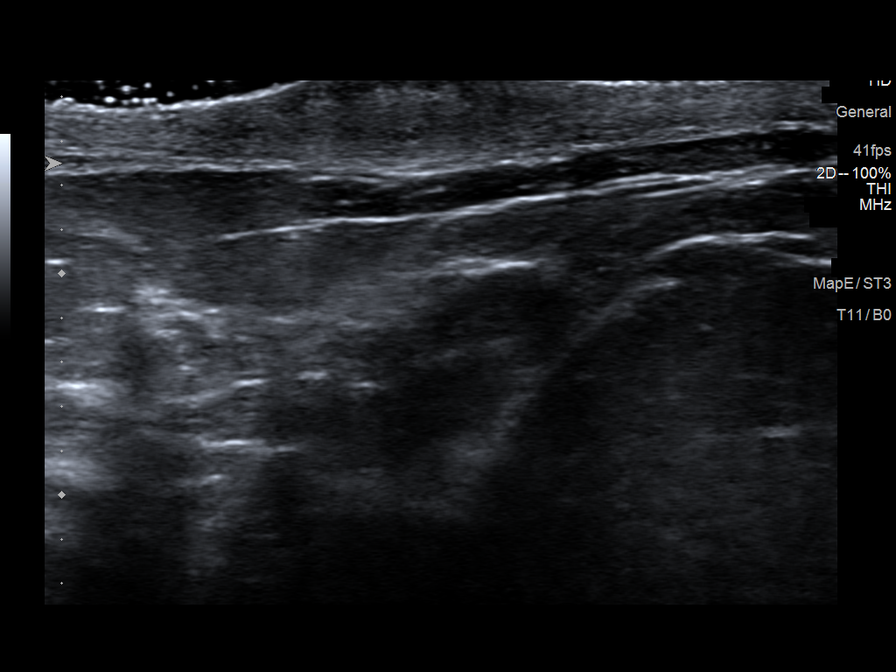
[im 5/7]
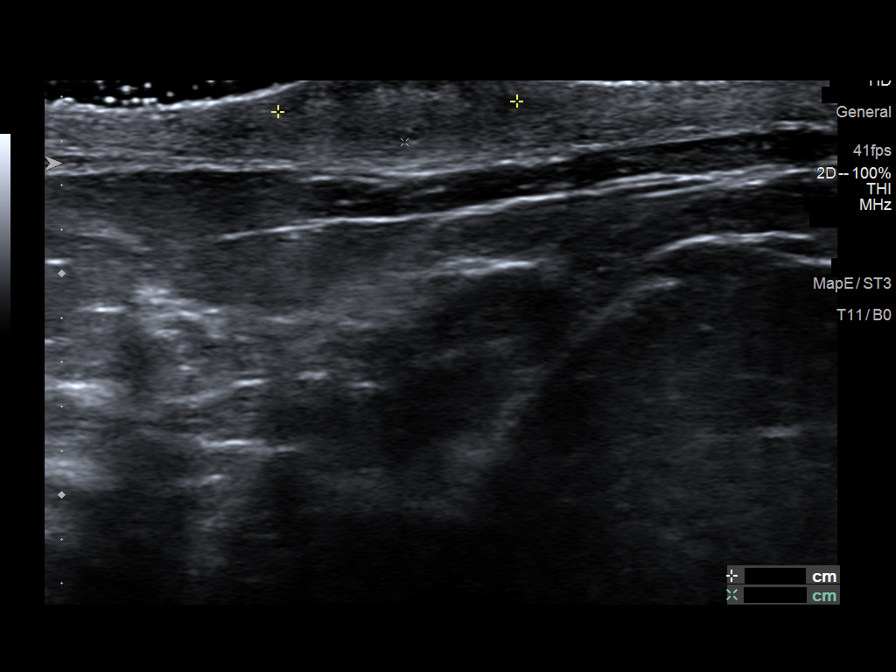
[im 6/7]
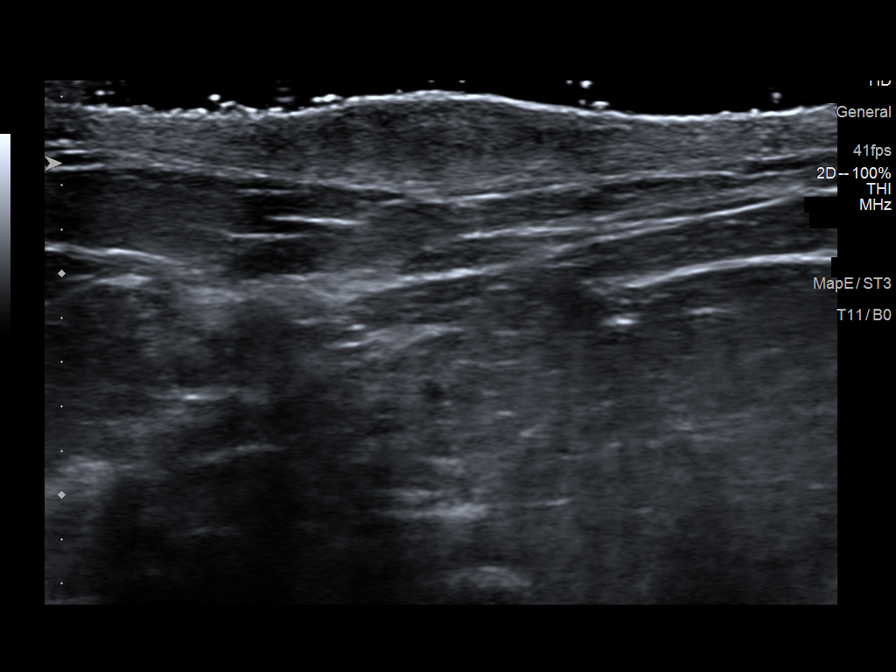
[im 7/7]
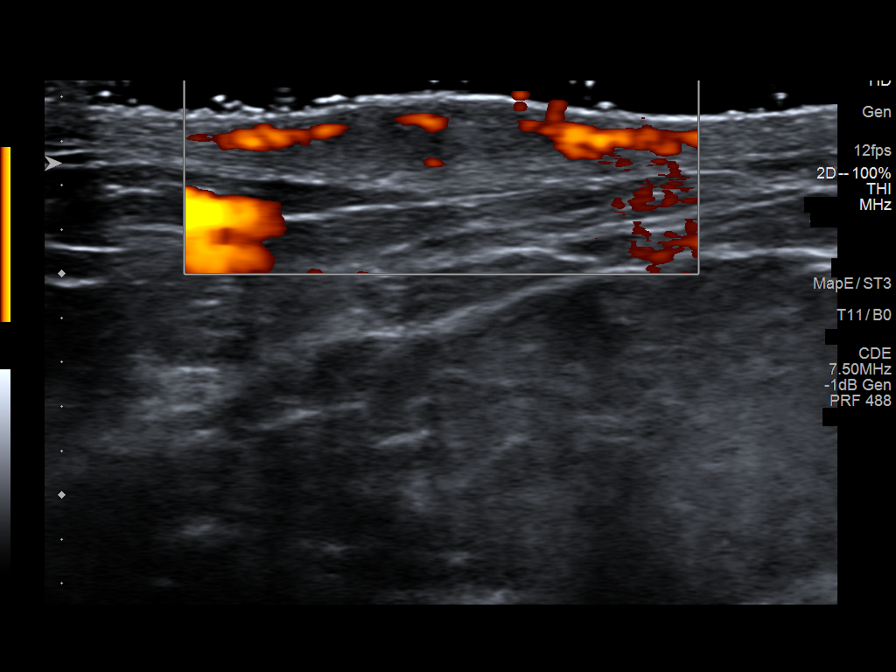

[7 of 7 positions shown; findings below may reference images not displayed]

FINDINGS: No suspicious masses or calcifications seen in either breast. Spot
compression tangential tomograms were performed over the palpable
area of concern in the slightly upper right breast demonstrating
possible focal skin thickening in this location.

Physical examination reveals a nickel sized area of brownish skin
discoloration with focal thickening.

Targeted ultrasound of the right breast was performed. There is
focal skin thickening with a near isoechoic mass within the skin of
the right breast at 12 o'clock 2 cm from nipple measuring 0.8 x
x 1.1 cm. This could represent an inflamed sebaceous cyst or
scarring from prior sebaceous cyst. No masses or abnormality seen in
the underlying breast.
IMPRESSION: 1. Skin lesion at site of palpable concern in the slightly upper
right breast, possibly an inflamed sebaceous cyst or scarring from
prior sebaceous cyst.

2.  No mammographic evidence of malignancy in either breast.

RECOMMENDATION:
1. Recommend further management of right breast skin lesion be based
on clinical assessment. If this is increasing in size or is
bothersome to the patient consider surgical consultation for
excision.

2. Screening mammogram at age 38 unless there are persistent or
intervening clinical concerns. (Code:0X-7-06V)

I have discussed the findings and recommendations with the patient.
If applicable, a reminder letter will be sent to the patient
regarding the next appointment.

BI-RADS CATEGORY  2: Benign.
